# Patient Record
Sex: Female | Born: 1945 | Race: White | Hispanic: No | Smoking: Former smoker
Health system: Southern US, Community
[De-identification: ages and names within clinical notes are randomized; demographics above are authoritative.]

## PROBLEM LIST (undated history)

## (undated) DIAGNOSIS — I219 Acute myocardial infarction, unspecified: Secondary | ICD-10-CM

## (undated) DIAGNOSIS — I639 Cerebral infarction, unspecified: Secondary | ICD-10-CM

## (undated) DIAGNOSIS — E119 Type 2 diabetes mellitus without complications: Secondary | ICD-10-CM

## (undated) DIAGNOSIS — I10 Essential (primary) hypertension: Secondary | ICD-10-CM

## (undated) DIAGNOSIS — I2510 Atherosclerotic heart disease of native coronary artery without angina pectoris: Secondary | ICD-10-CM

## (undated) HISTORY — DX: Cerebral infarction, unspecified: I63.9

## (undated) HISTORY — PX: APPENDECTOMY: SHX54

## (undated) HISTORY — DX: Acute myocardial infarction, unspecified: I21.9

## (undated) HISTORY — PX: CARDIAC CATHETERIZATION: SHX172

## (undated) HISTORY — PX: ABDOMINAL HYSTERECTOMY: SHX81

---

## 1998-12-28 ENCOUNTER — Other Ambulatory Visit: Admission: RE | Admit: 1998-12-28 | Discharge: 1998-12-28 | Payer: Self-pay | Admitting: Family Medicine

## 2002-06-01 ENCOUNTER — Inpatient Hospital Stay (HOSPITAL_COMMUNITY): Admission: AD | Admit: 2002-06-01 | Discharge: 2002-06-05 | Payer: Self-pay | Admitting: Cardiology

## 2004-10-04 ENCOUNTER — Ambulatory Visit: Payer: Self-pay | Admitting: Family Medicine

## 2004-10-18 ENCOUNTER — Ambulatory Visit: Payer: Self-pay | Admitting: Family Medicine

## 2005-02-02 ENCOUNTER — Ambulatory Visit: Payer: Self-pay | Admitting: Family Medicine

## 2005-05-08 ENCOUNTER — Ambulatory Visit: Payer: Self-pay | Admitting: Family Medicine

## 2005-06-08 ENCOUNTER — Ambulatory Visit: Payer: Self-pay | Admitting: Family Medicine

## 2005-10-02 ENCOUNTER — Ambulatory Visit: Payer: Self-pay | Admitting: Family Medicine

## 2017-02-12 DIAGNOSIS — N390 Urinary tract infection, site not specified: Secondary | ICD-10-CM

## 2017-02-12 DIAGNOSIS — N179 Acute kidney failure, unspecified: Secondary | ICD-10-CM

## 2017-02-13 DIAGNOSIS — N179 Acute kidney failure, unspecified: Secondary | ICD-10-CM | POA: Diagnosis not present

## 2017-02-13 DIAGNOSIS — N390 Urinary tract infection, site not specified: Secondary | ICD-10-CM | POA: Diagnosis not present

## 2017-02-14 DIAGNOSIS — F419 Anxiety disorder, unspecified: Secondary | ICD-10-CM | POA: Diagnosis not present

## 2017-02-14 DIAGNOSIS — N200 Calculus of kidney: Secondary | ICD-10-CM | POA: Diagnosis not present

## 2017-02-14 DIAGNOSIS — N390 Urinary tract infection, site not specified: Secondary | ICD-10-CM | POA: Diagnosis not present

## 2017-02-14 DIAGNOSIS — J9601 Acute respiratory failure with hypoxia: Secondary | ICD-10-CM | POA: Diagnosis not present

## 2017-02-14 DIAGNOSIS — A419 Sepsis, unspecified organism: Secondary | ICD-10-CM | POA: Diagnosis not present

## 2017-02-14 DIAGNOSIS — I10 Essential (primary) hypertension: Secondary | ICD-10-CM

## 2017-02-14 DIAGNOSIS — N23 Unspecified renal colic: Secondary | ICD-10-CM | POA: Diagnosis not present

## 2017-02-14 DIAGNOSIS — N179 Acute kidney failure, unspecified: Secondary | ICD-10-CM | POA: Diagnosis not present

## 2017-02-15 DIAGNOSIS — N179 Acute kidney failure, unspecified: Secondary | ICD-10-CM | POA: Diagnosis not present

## 2017-02-15 DIAGNOSIS — N200 Calculus of kidney: Secondary | ICD-10-CM | POA: Diagnosis not present

## 2017-02-15 DIAGNOSIS — A419 Sepsis, unspecified organism: Secondary | ICD-10-CM | POA: Diagnosis not present

## 2017-02-15 DIAGNOSIS — N390 Urinary tract infection, site not specified: Secondary | ICD-10-CM | POA: Diagnosis not present

## 2017-02-15 DIAGNOSIS — N23 Unspecified renal colic: Secondary | ICD-10-CM | POA: Diagnosis not present

## 2017-02-15 DIAGNOSIS — J9601 Acute respiratory failure with hypoxia: Secondary | ICD-10-CM | POA: Diagnosis not present

## 2017-02-15 DIAGNOSIS — I10 Essential (primary) hypertension: Secondary | ICD-10-CM | POA: Diagnosis not present

## 2017-02-15 DIAGNOSIS — F419 Anxiety disorder, unspecified: Secondary | ICD-10-CM | POA: Diagnosis not present

## 2017-02-16 DIAGNOSIS — A419 Sepsis, unspecified organism: Secondary | ICD-10-CM | POA: Diagnosis not present

## 2017-02-16 DIAGNOSIS — I10 Essential (primary) hypertension: Secondary | ICD-10-CM | POA: Diagnosis not present

## 2017-02-16 DIAGNOSIS — N179 Acute kidney failure, unspecified: Secondary | ICD-10-CM | POA: Diagnosis not present

## 2017-02-16 DIAGNOSIS — N390 Urinary tract infection, site not specified: Secondary | ICD-10-CM | POA: Diagnosis not present

## 2017-02-16 DIAGNOSIS — N23 Unspecified renal colic: Secondary | ICD-10-CM | POA: Diagnosis not present

## 2017-02-16 DIAGNOSIS — N200 Calculus of kidney: Secondary | ICD-10-CM | POA: Diagnosis not present

## 2017-02-16 DIAGNOSIS — F419 Anxiety disorder, unspecified: Secondary | ICD-10-CM | POA: Diagnosis not present

## 2017-02-16 DIAGNOSIS — J9601 Acute respiratory failure with hypoxia: Secondary | ICD-10-CM | POA: Diagnosis not present

## 2017-02-17 DIAGNOSIS — A419 Sepsis, unspecified organism: Secondary | ICD-10-CM | POA: Diagnosis not present

## 2017-02-17 DIAGNOSIS — E1169 Type 2 diabetes mellitus with other specified complication: Secondary | ICD-10-CM | POA: Diagnosis not present

## 2017-02-17 DIAGNOSIS — N23 Unspecified renal colic: Secondary | ICD-10-CM | POA: Diagnosis not present

## 2017-02-17 DIAGNOSIS — I10 Essential (primary) hypertension: Secondary | ICD-10-CM | POA: Diagnosis not present

## 2017-02-17 DIAGNOSIS — J9601 Acute respiratory failure with hypoxia: Secondary | ICD-10-CM | POA: Diagnosis not present

## 2017-02-17 DIAGNOSIS — N179 Acute kidney failure, unspecified: Secondary | ICD-10-CM | POA: Diagnosis not present

## 2017-02-17 DIAGNOSIS — N200 Calculus of kidney: Secondary | ICD-10-CM | POA: Diagnosis not present

## 2017-02-17 DIAGNOSIS — F419 Anxiety disorder, unspecified: Secondary | ICD-10-CM | POA: Diagnosis not present

## 2017-02-17 DIAGNOSIS — N390 Urinary tract infection, site not specified: Secondary | ICD-10-CM | POA: Diagnosis not present

## 2017-02-19 DIAGNOSIS — F419 Anxiety disorder, unspecified: Secondary | ICD-10-CM | POA: Diagnosis not present

## 2017-02-19 DIAGNOSIS — I10 Essential (primary) hypertension: Secondary | ICD-10-CM | POA: Diagnosis not present

## 2017-02-19 DIAGNOSIS — E1169 Type 2 diabetes mellitus with other specified complication: Secondary | ICD-10-CM | POA: Diagnosis not present

## 2017-02-19 DIAGNOSIS — N390 Urinary tract infection, site not specified: Secondary | ICD-10-CM | POA: Diagnosis not present

## 2017-02-19 DIAGNOSIS — E160 Drug-induced hypoglycemia without coma: Secondary | ICD-10-CM | POA: Diagnosis not present

## 2017-02-20 DIAGNOSIS — I10 Essential (primary) hypertension: Secondary | ICD-10-CM | POA: Diagnosis not present

## 2017-02-20 DIAGNOSIS — N390 Urinary tract infection, site not specified: Secondary | ICD-10-CM | POA: Diagnosis not present

## 2017-02-20 DIAGNOSIS — E1169 Type 2 diabetes mellitus with other specified complication: Secondary | ICD-10-CM | POA: Diagnosis not present

## 2017-02-20 DIAGNOSIS — F419 Anxiety disorder, unspecified: Secondary | ICD-10-CM | POA: Diagnosis not present

## 2017-02-20 DIAGNOSIS — E160 Drug-induced hypoglycemia without coma: Secondary | ICD-10-CM | POA: Diagnosis not present

## 2017-02-21 DIAGNOSIS — N200 Calculus of kidney: Secondary | ICD-10-CM | POA: Diagnosis not present

## 2017-02-21 DIAGNOSIS — E160 Drug-induced hypoglycemia without coma: Secondary | ICD-10-CM | POA: Diagnosis not present

## 2017-02-21 DIAGNOSIS — F419 Anxiety disorder, unspecified: Secondary | ICD-10-CM | POA: Diagnosis not present

## 2017-02-21 DIAGNOSIS — E1169 Type 2 diabetes mellitus with other specified complication: Secondary | ICD-10-CM | POA: Diagnosis not present

## 2017-02-21 DIAGNOSIS — I10 Essential (primary) hypertension: Secondary | ICD-10-CM | POA: Diagnosis not present

## 2017-02-21 DIAGNOSIS — N390 Urinary tract infection, site not specified: Secondary | ICD-10-CM | POA: Diagnosis not present

## 2017-02-22 DIAGNOSIS — E160 Drug-induced hypoglycemia without coma: Secondary | ICD-10-CM | POA: Diagnosis not present

## 2017-02-22 DIAGNOSIS — I10 Essential (primary) hypertension: Secondary | ICD-10-CM | POA: Diagnosis not present

## 2017-02-22 DIAGNOSIS — E1169 Type 2 diabetes mellitus with other specified complication: Secondary | ICD-10-CM | POA: Diagnosis not present

## 2017-02-22 DIAGNOSIS — F419 Anxiety disorder, unspecified: Secondary | ICD-10-CM | POA: Diagnosis not present

## 2017-02-22 DIAGNOSIS — N200 Calculus of kidney: Secondary | ICD-10-CM | POA: Diagnosis not present

## 2017-02-22 DIAGNOSIS — N390 Urinary tract infection, site not specified: Secondary | ICD-10-CM | POA: Diagnosis not present

## 2017-02-23 DIAGNOSIS — F419 Anxiety disorder, unspecified: Secondary | ICD-10-CM | POA: Diagnosis not present

## 2017-02-23 DIAGNOSIS — N200 Calculus of kidney: Secondary | ICD-10-CM | POA: Diagnosis not present

## 2017-02-23 DIAGNOSIS — I10 Essential (primary) hypertension: Secondary | ICD-10-CM | POA: Diagnosis not present

## 2017-02-23 DIAGNOSIS — E1169 Type 2 diabetes mellitus with other specified complication: Secondary | ICD-10-CM | POA: Diagnosis not present

## 2017-02-23 DIAGNOSIS — E160 Drug-induced hypoglycemia without coma: Secondary | ICD-10-CM | POA: Diagnosis not present

## 2017-02-23 DIAGNOSIS — N390 Urinary tract infection, site not specified: Secondary | ICD-10-CM | POA: Diagnosis not present

## 2017-05-13 ENCOUNTER — Inpatient Hospital Stay (HOSPITAL_COMMUNITY)
Admission: AD | Admit: 2017-05-13 | Discharge: 2017-05-15 | DRG: 066 | Disposition: A | Discharge status: Home / Self Care | Payer: Medicare Other | Source: Other Acute Inpatient Hospital | Attending: Family Medicine | Admitting: Family Medicine

## 2017-05-13 ENCOUNTER — Encounter (HOSPITAL_COMMUNITY): Payer: Self-pay | Admitting: Internal Medicine

## 2017-05-13 DIAGNOSIS — E1151 Type 2 diabetes mellitus with diabetic peripheral angiopathy without gangrene: Secondary | ICD-10-CM | POA: Diagnosis present

## 2017-05-13 DIAGNOSIS — E669 Obesity, unspecified: Secondary | ICD-10-CM | POA: Diagnosis present

## 2017-05-13 DIAGNOSIS — Z90710 Acquired absence of both cervix and uterus: Secondary | ICD-10-CM | POA: Diagnosis not present

## 2017-05-13 DIAGNOSIS — G8321 Monoplegia of upper limb affecting right dominant side: Secondary | ICD-10-CM | POA: Diagnosis present

## 2017-05-13 DIAGNOSIS — G459 Transient cerebral ischemic attack, unspecified: Secondary | ICD-10-CM | POA: Diagnosis present

## 2017-05-13 DIAGNOSIS — E039 Hypothyroidism, unspecified: Secondary | ICD-10-CM | POA: Diagnosis present

## 2017-05-13 DIAGNOSIS — Z955 Presence of coronary angioplasty implant and graft: Secondary | ICD-10-CM

## 2017-05-13 DIAGNOSIS — Z6835 Body mass index (BMI) 35.0-35.9, adult: Secondary | ICD-10-CM

## 2017-05-13 DIAGNOSIS — Z87891 Personal history of nicotine dependence: Secondary | ICD-10-CM

## 2017-05-13 DIAGNOSIS — M720 Palmar fascial fibromatosis [Dupuytren]: Secondary | ICD-10-CM | POA: Diagnosis present

## 2017-05-13 DIAGNOSIS — E876 Hypokalemia: Secondary | ICD-10-CM | POA: Diagnosis present

## 2017-05-13 DIAGNOSIS — R29701 NIHSS score 1: Secondary | ICD-10-CM | POA: Diagnosis present

## 2017-05-13 DIAGNOSIS — E1159 Type 2 diabetes mellitus with other circulatory complications: Secondary | ICD-10-CM | POA: Diagnosis present

## 2017-05-13 DIAGNOSIS — I2510 Atherosclerotic heart disease of native coronary artery without angina pectoris: Secondary | ICD-10-CM

## 2017-05-13 DIAGNOSIS — I10 Essential (primary) hypertension: Secondary | ICD-10-CM | POA: Diagnosis present

## 2017-05-13 DIAGNOSIS — I639 Cerebral infarction, unspecified: Principal | ICD-10-CM | POA: Diagnosis present

## 2017-05-13 DIAGNOSIS — E785 Hyperlipidemia, unspecified: Secondary | ICD-10-CM | POA: Diagnosis present

## 2017-05-13 DIAGNOSIS — G458 Other transient cerebral ischemic attacks and related syndromes: Secondary | ICD-10-CM | POA: Diagnosis not present

## 2017-05-13 HISTORY — DX: Essential (primary) hypertension: I10

## 2017-05-13 HISTORY — DX: Type 2 diabetes mellitus without complications: E11.9

## 2017-05-13 HISTORY — DX: Atherosclerotic heart disease of native coronary artery without angina pectoris: I25.10

## 2017-05-13 MED ORDER — STROKE: EARLY STAGES OF RECOVERY BOOK
Freq: Once | Status: AC
  Administered 2017-05-14
  Filled 2017-05-13: qty 1

## 2017-05-13 MED ORDER — LEVOTHYROXINE SODIUM 100 MCG IV SOLR
50.0000 ug | Freq: Every day | INTRAVENOUS | Status: DC
  Administered 2017-05-14 – 2017-05-15 (×2): 50 ug via INTRAVENOUS
  Filled 2017-05-13 (×2): qty 5

## 2017-05-13 MED ORDER — ACETAMINOPHEN 160 MG/5ML PO SOLN: 650.0000 mg | ORAL | Status: DC | PRN

## 2017-05-13 MED ORDER — INSULIN DETEMIR 100 UNIT/ML ~~LOC~~ SOLN
20.0000 [IU] | Freq: Two times a day (BID) | SUBCUTANEOUS | Status: DC
  Administered 2017-05-14 – 2017-05-15 (×3): 20 [IU] via SUBCUTANEOUS
  Filled 2017-05-13 (×4): qty 0.2

## 2017-05-13 MED ORDER — ASPIRIN 325 MG PO TABS
325.0000 mg | ORAL_TABLET | Freq: Every day | ORAL | Status: DC
  Administered 2017-05-14 – 2017-05-15 (×2): 325 mg via ORAL
  Filled 2017-05-13 (×2): qty 1

## 2017-05-13 MED ORDER — ACETAMINOPHEN 650 MG RE SUPP: 650.0000 mg | RECTAL | Status: DC | PRN

## 2017-05-13 MED ORDER — INSULIN ASPART 100 UNIT/ML ~~LOC~~ SOLN
0.0000 [IU] | Freq: Three times a day (TID) | SUBCUTANEOUS | Status: DC
  Administered 2017-05-14: 2 [IU] via SUBCUTANEOUS
  Administered 2017-05-14: 1 [IU] via SUBCUTANEOUS
  Administered 2017-05-14: 2 [IU] via SUBCUTANEOUS
  Administered 2017-05-15: 3 [IU] via SUBCUTANEOUS

## 2017-05-13 MED ORDER — ENOXAPARIN SODIUM 40 MG/0.4ML ~~LOC~~ SOLN
40.0000 mg | Freq: Every day | SUBCUTANEOUS | Status: DC
  Administered 2017-05-14 – 2017-05-15 (×2): 40 mg via SUBCUTANEOUS
  Filled 2017-05-13 (×2): qty 0.4

## 2017-05-13 MED ORDER — IRBESARTAN 300 MG PO TABS
300.0000 mg | ORAL_TABLET | Freq: Every day | ORAL | Status: DC
  Administered 2017-05-14 – 2017-05-15 (×2): 300 mg via ORAL
  Filled 2017-05-13 (×2): qty 1

## 2017-05-13 MED ORDER — ACETAMINOPHEN 325 MG PO TABS: 650.0000 mg | ORAL_TABLET | ORAL | Status: DC | PRN

## 2017-05-13 MED ORDER — ASPIRIN 300 MG RE SUPP: 300.0000 mg | Freq: Every day | RECTAL | Status: DC
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
## 2017-05-14 ENCOUNTER — Inpatient Hospital Stay (HOSPITAL_COMMUNITY): Payer: Medicare Other

## 2017-05-14 DIAGNOSIS — G459 Transient cerebral ischemic attack, unspecified: Secondary | ICD-10-CM

## 2017-05-14 DIAGNOSIS — G458 Other transient cerebral ischemic attacks and related syndromes: Secondary | ICD-10-CM

## 2017-05-14 LAB — COMPREHENSIVE METABOLIC PANEL
ALBUMIN: 3.1 g/dL — AB (ref 3.5–5.0)
ALT: 16 U/L (ref 14–54)
ANION GAP: 8 (ref 5–15)
AST: 25 U/L (ref 15–41)
Alkaline Phosphatase: 49 U/L (ref 38–126)
CHLORIDE: 105 mmol/L (ref 101–111)
CO2: 26 mmol/L (ref 22–32)
Calcium: 8.9 mg/dL (ref 8.9–10.3)
Creatinine, Ser: 1.04 mg/dL — ABNORMAL HIGH (ref 0.44–1.00)
GFR calc Af Amer: >60 mL/min (ref >60)
GFR calc non Af Amer: 53 mL/min — ABNORMAL LOW (ref >60)
GLUCOSE: 172 mg/dL — AB (ref 65–99)
POTASSIUM: 2.5 mmol/L — AB (ref 3.5–5.1)
SODIUM: 139 mmol/L (ref 135–145)
Total Bilirubin: 0.5 mg/dL (ref 0.3–1.2)
Total Protein: 5.7 g/dL — ABNORMAL LOW (ref 6.5–8.1)

## 2017-05-14 LAB — CBC
HEMATOCRIT: 39.1 % (ref 36.0–46.0)
HEMOGLOBIN: 13 g/dL (ref 12.0–15.0)
MCH: 28.9 pg (ref 26.0–34.0)
MCHC: 33.2 g/dL (ref 30.0–36.0)
MCV: 86.9 fL (ref 78.0–100.0)
Platelets: 293 10*3/uL (ref 150–400)
RBC: 4.5 MIL/uL (ref 3.87–5.11)
RDW: 14.1 % (ref 11.5–15.5)
WBC: 7.4 10*3/uL (ref 4.0–10.5)

## 2017-05-14 LAB — VAS US CAROTID
LCCAPDIAS: 7 cm/s
LCCAPSYS: 67 cm/s
LEFT ECA DIAS: -12 cm/s
LEFT VERTEBRAL DIAS: 11 cm/s
LICADDIAS: -22 cm/s
LICAPDIAS: -13 cm/s
LICAPSYS: -73 cm/s
Left CCA dist dias: -11 cm/s
Left CCA dist sys: -62 cm/s
Left ICA dist sys: -93 cm/s
RCCAPDIAS: 22 cm/s
RIGHT ECA DIAS: -3 cm/s
RIGHT VERTEBRAL DIAS: 6 cm/s
Right CCA prox sys: 71 cm/s
Right cca dist sys: -71 cm/s

## 2017-05-14 LAB — LIPID PANEL
CHOLESTEROL: 257 mg/dL — AB (ref 0–200)
HDL: 36 mg/dL — ABNORMAL LOW (ref >40)
LDL CALC: 187 mg/dL — AB (ref 0–99)
TRIGLYCERIDES: 168 mg/dL — AB (ref <150)
Total CHOL/HDL Ratio: 7.1 RATIO
VLDL: 34 mg/dL (ref 0–40)

## 2017-05-14 LAB — GLUCOSE, CAPILLARY
GLUCOSE-CAPILLARY: 167 mg/dL — AB (ref 65–99)
GLUCOSE-CAPILLARY: 144 mg/dL — AB (ref 65–99)
GLUCOSE-CAPILLARY: 201 mg/dL — AB (ref 65–99)
Glucose-Capillary: 172 mg/dL — ABNORMAL HIGH (ref 65–99)

## 2017-05-14 LAB — ECHOCARDIOGRAM COMPLETE
HEIGHTINCHES: 60 in
Weight: 2926.4 oz

## 2017-05-14 LAB — MAGNESIUM: Magnesium: 1.7 mg/dL (ref 1.7–2.4)

## 2017-05-14 LAB — HEMOGLOBIN A1C
Hgb A1c MFr Bld: 8.2 % — ABNORMAL HIGH (ref 4.8–5.6)
Mean Plasma Glucose: 188.64 mg/dL

## 2017-05-14 MED ORDER — ATORVASTATIN CALCIUM 80 MG PO TABS
80.0000 mg | ORAL_TABLET | Freq: Every day | ORAL | Status: DC
  Administered 2017-05-14: 80 mg via ORAL
  Filled 2017-05-14: qty 1

## 2017-05-14 MED ORDER — POTASSIUM CHLORIDE CRYS ER 20 MEQ PO TBCR
40.0000 meq | EXTENDED_RELEASE_TABLET | Freq: Once | ORAL | Status: AC
  Administered 2017-05-14: 40 meq via ORAL
  Filled 2017-05-14: qty 2

## 2017-05-14 MED ORDER — POTASSIUM CHLORIDE 10 MEQ/100ML IV SOLN
10.0000 meq | INTRAVENOUS | Status: AC
  Administered 2017-05-14 (×4): 10 meq via INTRAVENOUS
  Filled 2017-05-14 (×4): qty 100

## 2017-05-14 MED ORDER — POTASSIUM CHLORIDE 10 MEQ/100ML IV SOLN: 10.0000 meq | INTRAVENOUS | Status: DC

## 2017-05-14 NOTE — Evaluation (Signed)
Physical Therapy Evaluation Patient Details Name: Maria Thomas MRN: [id number] DOB: 1946-02-05 Today's Date: 05/14/2017   History of Present Illness  Maria Thomas is a 71 y.o. female with history of diabetes mellitus, CAD status post stenting, hypertension, hypothyroidism had presented to the ER at [facility] with complaints of right upper extremity numbness and numbness in her tongue. Patient is a poor historian and history changes frequently.  Clinical Impression  Pt admitted with above. Unsure of pt's baseline cognition. Pt reports she is sedentary at baseline and her and her son only eat fast food. She cares for her self and her son does the driving and household chores. Pt became very SOB, SPO2 >92% on RA during amb and stair negotiation requiring to sit. This could be due to patients lifestyle as she doesn't amb more than household distances. Pt did not report or demo any R or L sided numbness or weakness. Pt mildly unsteady on feet however suspect she is functioning near her baseline. Acute PT to con't to follow to progress activity tolerance and improve stability.    Follow Up Recommendations No PT follow up;Supervision/Assistance - 24 hour    Equipment Recommendations  None recommended by PT    Recommendations for Other Services       Precautions / Restrictions Precautions Precautions: Fall Precaution Comments: SOB with activity Restrictions Weight Bearing Restrictions: No      Mobility  Bed Mobility Overal bed mobility: Modified Independent             General bed mobility comments: pt able to complete without physical help but was labored effort  Transfers Overall transfer level: Needs assistance Equipment used: None Transfers: Sit to/from Stand Sit to Stand: Min guard            Ambulation/Gait Ambulation/Gait assistance: Min guard Ambulation Distance (Feet): 120 Feet Assistive device: None Gait Pattern/deviations: Step-through pattern Gait  velocity: initially fast but then slowed down due to SOB Gait velocity interpretation: at or above normal speed for age/gender (then decreased) General Gait Details: pt started out at quick pace but then became SOB and could not walk any further requiring a chair to sit  Stairs Stairs: Yes Stairs assistance: Min guard Stair Management: One rail Right;Alternating pattern Number of Stairs: 5 General stair comments: + SOB but did not need physical assist  Wheelchair Mobility    Modified Rankin (Stroke Patients Only) Modified Rankin (Stroke Patients Only) Pre-Morbid Rankin Score: No significant disability Modified Rankin: Slight disability     Balance Overall balance assessment: No apparent balance deficits (not formally assessed)                                           Pertinent Vitals/Pain Pain Assessment: No/denies pain    Home Living Family/patient expects to be discharged to:: Private residence Living Arrangements: Children (adult son) Available Help at Discharge: Family;Available 24 hours/day Type of Home: House Home Access: Stairs to enter Entrance Stairs-Rails: Right Entrance Stairs-Number of Steps: 5 Home Layout: One level Home Equipment: None      Prior Function Level of Independence: Independent         Comments: pt reports she never leaves the house because she doesn't have reason too. Pt reports they don't cook and eats fast food for all meals. Son goes to get it. Pt is able to do personal hygiene but reports wearing  the same clothes at home.      Hand Dominance        Extremity/Trunk Assessment   Upper Extremity Assessment Upper Extremity Assessment: RUE deficits/detail RUE Deficits / Details: 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, tricep 2+/5    Lower Extremity Assessment Lower Extremity Assessment: Overall WFL for tasks assessed    Cervical / Trunk Assessment Cervical / Trunk Assessment: Normal  Communication   Communication:  No difficulties  Cognition Arousal/Alertness: Awake/alert Behavior During Therapy: WFL for tasks assessed/performed Overall Cognitive Status: No family/caregiver present to determine baseline cognitive functioning                                 General Comments: pt not oriented to date, year only. Pt poor historian and changes home set up and history.      General Comments      Exercises     Assessment/Plan    PT Assessment Patient needs continued PT services  PT Problem List Decreased strength;Decreased activity tolerance;Decreased balance;Decreased mobility;Cardiopulmonary status limiting activity;Decreased cognition;Decreased safety awareness;Decreased knowledge of precautions       PT Treatment Interventions DME instruction;Gait training;Stair training;Functional mobility training;Therapeutic activities;Therapeutic exercise    PT Goals (Current goals can be found in the Care Plan section)  Acute Rehab PT Goals Patient Stated Goal: return home PT Goal Formulation: With patient Time For Goal Achievement: 05/21/17 Potential to Achieve Goals: Good Additional Goals Additional Goal #1: Pt to score >19 on DGI to indicate minimal falls risk.    Frequency Min 3X/week   Barriers to discharge        Co-evaluation               AM-PAC PT "6 Clicks" Daily Activity  Outcome Measure Difficulty turning over in bed (including adjusting bedclothes, sheets and blankets)?: None Difficulty moving from lying on back to sitting on the side of the bed? : None Difficulty sitting down on and standing up from a chair with arms (e.g., wheelchair, bedside commode, etc,.)?: None Help needed moving to and from a bed to chair (including a wheelchair)?: A Little Help needed walking in hospital room?: A Little Help needed climbing 3-5 steps with a railing? : A Little 6 Click Score: 21    End of Session Equipment Utilized During Treatment: Gait belt Activity Tolerance:   (limited by SOB) Patient left: in chair;with call bell/phone within reach;with chair alarm set;with nursing/sitter in room Nurse Communication: Mobility status PT Visit Diagnosis: Unsteadiness on feet (R26.81)    Time: 2956-2130 PT Time Calculation (min) (ACUTE ONLY): 23 min   Charges:   PT Evaluation $PT Eval Moderate Complexity: 1 Mod PT Treatments $Gait Training: 8-22 mins   PT G Codes:        Lewis Shock, PT, DPT Pager #: [phone] Office #: [phone]   Jakyron Fabro M Menucha Dicesare 05/14/2017, 9:50 AM

## 2017-05-14 NOTE — Progress Notes (Signed)
STROKE TEAM PROGRESS NOTE   HISTORY OF PRESENT ILLNESS (per record) Maria Thomas is an 71 y.o. female CAD, DM and HTN with R arm Dupuytren's contracture who presented to [facility] for urinary symptoms and lower abdominal pain.  She stated that on 9/1 she felt weak in her right arm for most of the day- she thought it was because she used it more than normal for her hobby of completing jigsaw puzzles. Her symptoms resolved and she denies any sudden onset weakness in any one extremity, denies unilateral numbness, loss of vision, slurring of speech, facial droop or gait imbalance.   She was transferred to [facility] for TIA workup.    Patient was not administered IV t-PA secondary to resolution of symptoms . She was admitted to General Neurology for further evaluation and treatment.   SUBJECTIVE (INTERVAL HISTORY) Her son is at the bedside.  The patient is awake, alert, and follows all commands appropriately.  LDL 187.  HgbA1C 8.2   OBJECTIVE Temp:  [97.8 F (36.6 C)-98.5 F (36.9 C)] 98.2 F (36.8 C) (09/03 1500) Pulse Rate:  [77-89] 79 (09/03 1500) Cardiac Rhythm: Normal sinus rhythm (09/03 0700) Resp:  [17-18] 18 (09/03 1500) BP: (134-168)/(50-122) 159/84 (09/03 1500) SpO2:  [96 %-100 %] 100 % (09/03 1500) Weight:  [83 kg (182 lb 14.4 oz)] 83 kg (182 lb 14.4 oz) (09/02 2218)  CBC:   Recent Labs Lab 05/14/17 0112  WBC 7.4  HGB 13.0  HCT 39.1  MCV 86.9  PLT 293    Basic Metabolic Panel:   Recent Labs Lab 05/14/17 0112  NA 139  K 2.5*  CL 105  CO2 26  GLUCOSE 172*  BUN <5*  CREATININE 1.04*  CALCIUM 8.9  MG 1.7    Lipid Panel:     Component Value Date/Time   CHOL 257 (H) 05/14/2017 0112   TRIG 168 (H) 05/14/2017 0112   HDL 36 (L) 05/14/2017 0112   CHOLHDL 7.1 05/14/2017 0112   VLDL 34 05/14/2017 0112   LDLCALC 187 (H) 05/14/2017 0112   HgbA1c:  Lab Results  Component Value Date   HGBA1C 8.2 (H) 05/14/2017   Urine Drug Screen: No results  found for: LABOPIA, COCAINSCRNUR, LABBENZ, AMPHETMU, THCU, LABBARB  Alcohol Level No results found for: [facility]  IMAGING  Dg Chest 2 View 05/14/2017 IMPRESSION: No active cardiopulmonary disease.   Mr Brain 29Wo Contrast Mr Maxine GlennMra Head Wo Contrast 05/14/2017 IMPRESSION: 1. Small acute lacunar infarct in the left thalamus with no associated hemorrhage or mass effect. 2. 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 No other acute intracranial abnormality. 3. Intracranial atherosclerosis with mild to moderate right ICA siphon stenosis, but otherwise negative for age intracranial MRA.  Carotid US 05/14/2017 Summary: Right: intimal wall thickening CCA. Mild to moderate calcific plaque origin and proximal ICA with acoustic shadowing. Tortuous distal ICA. Left: intimal wall thickening CCA. Mild mixed plaque origin and proximal ICA. Bilateral: 1-39% ICA stenosis. Vertebral artery flow is antegrade.  TTE 05/14/2017 Study Conclusions - Left ventricle: The cavity size was normal. Wall thickness was normal. Systolic function was normal. The estimated ejection fraction was in the range of 55% to 60%. Wall motion was normal; there were no regional wall motion abnormalities. - Mitral valve: Calcified annulus. - Left atrium: The atrium was mildly dilated.  PHYSICAL EXAM : Pleasant elderly Caucasian lady currently not in distress   . Afebrile. Head is nontraumatic. Neck is supple without bruit.  Cardiac exam no murmur or gallop. Lungs are clear to auscultation. Distal pulses are well felt.  Neurological Exam ;  Awake  Alert oriented x 3. Normal speech and language.eye movements full without nystagmus.fundi were not visualized. Vision acuity and fields appear normal. Hearing is normal. Palatal movements are normal. Face symmetric. Tongue midline. Normal strength, tone, reflexes and coordination except diminished fine finger movements on the right. Orbits left over right upper  extremity. Mild weakness of right grip.. Normal sensation. Gait deferred.  ASSESSMENT/PLAN Maria Thomas is a 71 y.o. female with history of CAD, DM and HTN with R arm Dupuytren's contracture who presented to [facility] for urinary symptoms and lower abdominal pain.  She stated that on 9/1 she felt weak in her right arm for most of the day- she thought it was because she used it more than normal for her hobby of completing jigsaw puzzles. She did not receive IV t-PA due to rapid resolution of symptoms.   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  Resultant  mild weakness in the right hand  CT head: no acute stroke.  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  MRI head: Small acute lacunar infarct in the left thalamus with no associated hemorrhage or mass effect.   MRA head: No LVO or high-grade stenosis.  Moderate R ICA stenosis.  Atherosclerosis.  Carotid Doppler: B ICA 1-39% stenosis, VAs antegrade  2D Echo: EF 55-60%. No source of embolus   LDL 187  HgbA1c 8.2  for VTE prophylaxis Diet heart healthy/carb modified Room service appropriate? Yes; Fluid consistency: Thin  No antithrombotic prior to admission, now on aspirin 325 mg daily  Patient counseled to be compliant with her antithrombotic medications  Ongoing aggressive stroke risk factor management  Therapy recommendations: None  Disposition: None  Hypertension  Stable  Permissive hypertension (OK if < 220/120) but gradually normalize in 5-7 days  Long-term BP goal normotensive  Hyperlipidemia  Home meds:  none  LDL 187, goal < 70  Add atorvastatin 80 mg PO daily  Continue statin at discharge  Diabetes  HgbA1c 8.2, goal < 7.0  Uncontrolled  Diabetes Coordinator consulted  Other Stroke Risk Factors  Advanced  age  Obesity, Body mass index is 35.72 kg/m., recommend weight loss, diet and exercise as appropriate   Coronary artery disease  Other Active Problems  None  Hospital day # 1 I have personally examined this patient, reviewed notes, independently viewed imaging studies, participated in medical decision making and plan of care.ROS completed by me personally and pertinent positives fully documented  I have made any additions or clarifications directly to the above note.  She presented with right arm weakness secondary to lacunar left thalamic infarct from small vessel disease. Start aspirin and Lipitor and aggressive risk factor modification and continue ongoing stroke evaluation. Greater than 50% time during this 35 minute visit was spent on counseling and coordination of care about lacunar infarct, stroke risk, evaluation, prevention and treatment discussion.  Delia Heady, MD Medical Director [facility] Pager: [phone] 05/14/2017 10:14 PM   To contact Stroke Continuity provider, please refer to [email or web address]. After hours, contact General Neurology

## 2017-05-14 NOTE — Evaluation (Signed)
Speech Language Pathology Evaluation Patient Details Name: Maria ComberLinda S Thomas MRN: [id number] DOB: [birth year] Today's Date: 05/14/2017 Time: [id number] SLP Time Calculation (min) (ACUTE ONLY): 26 min  Problem List:  Patient Active Problem List   Diagnosis Date Noted  . TIA (transient ischemic attack) 05/13/2017  . CAD (coronary artery disease) S/P STENT. 05/13/2017  . Type 2 diabetes mellitus with vascular disease (HCC) 05/13/2017  . Essential hypertension 05/13/2017   Past Medical History:  Past Medical History:  Diagnosis Date  . Coronary artery disease   . Diabetes mellitus without complication (HCC)   . Hypertension    Past Surgical History:  Past Surgical History:  Procedure Laterality Date  . ABDOMINAL HYSTERECTOMY    . APPENDECTOMY    . CARDIAC CATHETERIZATION     HPI:  71 y.o. female with history of diabetes mellitus, CAD status post stenting, hypertension, hypothyroidism had presented [facility] with complaints of right upper extremity numbness and numbness in her tongue. Patient is a poor historian and history changes frequently.  Dx TIA.   Assessment / Plan / Recommendation Clinical Impression  Pt presents with baseline deficits in short-term, verbal memory, with functional attention, normal language and speech.  Pt spends 5-6 hours/day working on puzzles and completing crosswords.  She has a sedentary lifestyle and limited responsibilities, as her son handles the housework/providing meals.  No SLP needs are identified - our services will sign off.     SLP Assessment  SLP Recommendation/Assessment: Patient does not need any further Speech Lanaguage Pathology Services    Follow Up Recommendations  None    Frequency and Duration           SLP Evaluation Cognition  Overall Cognitive Status: History of cognitive impairments - at baseline Arousal/Alertness: Awake/alert Orientation Level: Oriented to person;Oriented to place;Oriented to  time;Disoriented to situation Attention: Sustained Sustained Attention: Appears intact Memory: Impaired Memory Impairment: Retrieval deficit Safety/Judgment: Appears intact       Comprehension  Auditory Comprehension Overall Auditory Comprehension: Appears within functional limits for tasks assessed Visual Recognition/Discrimination Discrimination: Within Function Limits Reading Comprehension Reading Status: Within funtional limits    Expression Expression Primary Mode of Expression: Verbal Verbal Expression Overall Verbal Expression: Appears within functional limits for tasks assessed Written Expression Dominant Hand: Right Written Expression: Not tested   Oral / Motor  Oral Motor/Sensory Function Overall Oral Motor/Sensory Function: Within functional limits Motor Speech Overall Motor Speech: Appears within functional limits for tasks assessed   GO                    Blenda MountsCouture, Cindel Daugherty Laurice 05/14/2017, 4:02 PM

## 2017-05-14 NOTE — Progress Notes (Signed)
VASCULAR LAB PRELIMINARY  PRELIMINARY  PRELIMINARY  PRELIMINARY  Carotid duplex completed.    Preliminary report:  1-39% ICA stenosis.  Vertebral artery flow is antegrade.   Yomara Toothman, RVT 05/14/2017, 12:30 PM

## 2017-05-14 NOTE — Evaluation (Addendum)
Occupational Therapy Evaluation Patient Details Name: Maria Thomas Noh MRN: [id number] DOB: 1946-09-07 Today'Thomas Date: 05/14/2017    History of Present Illness Maria Thomas Levario is a 71 y.o. female with history of diabetes mellitus, CAD status post stenting, hypertension, hypothyroidism had presented to the ER at [facility] with complaints of right upper extremity numbness and numbness in her tongue. Patient is a poor historian and history changes frequently.   Clinical Impression   This 71 y/o F presents with the above. At baseline Pt is independent with ADLs and functional mobility. Pt lives with son who provides intermittent assistance at home and who assists with iADLs. Per Pt'Thomas son, Pt has baseline short term memory deficits. Pt completed room level functional mobility this session with MinGuard assist without AD, LB ADLs with MinA. Will continue to follow acutely to maximize Pt'Thomas overall safety and independence with ADLs and functional mobility prior to return home.     Follow Up Recommendations  No OT follow up;Supervision/Assistance - 24 hour    Equipment Recommendations  None recommended by OT           Precautions / Restrictions Precautions Precautions: Fall Precaution Comments: SOB with activity Restrictions Weight Bearing Restrictions: No      Mobility Bed Mobility Overal bed mobility: Modified Independent             General bed mobility comments: pt able to complete without physical help but was labored effort  Transfers Overall transfer level: Needs assistance Equipment used: None Transfers: Sit to/from Stand Sit to Stand: Min guard         General transfer comment: close guard for safety     Balance Overall balance assessment: No apparent balance deficits (not formally assessed)                                         ADL either performed or assessed with clinical judgement   ADL Overall ADL'Thomas : Needs  assistance/impaired Eating/Feeding: Set up;Sitting   Grooming: Oral care;Min guard;Standing   Upper Body Bathing: Min guard;Sitting   Lower Body Bathing: Min guard;Sit to/from stand   Upper Body Dressing : Minimal assistance;Sitting Upper Body Dressing Details (indicate cue type and reason): donning new gown  Lower Body Dressing: Minimal assistance;Sit to/from stand Lower Body Dressing Details (indicate cue type and reason): Pt adjusting socks sitting EOB without assist  Toilet Transfer: Min guard;Ambulation;Regular Social workerToilet   Toileting- Clothing Manipulation and Hygiene: Min guard;Sit to/from stand       Functional mobility during ADLs: Min guard       Vision Baseline Vision/History: Wears glasses Wears Glasses: Reading only Vision Assessment?: No apparent visual deficits                Pertinent Vitals/Pain Pain Assessment: No/denies pain     Hand Dominance Right   Extremity/Trunk Assessment Upper Extremity Assessment Upper Extremity Assessment: RUE deficits/detail RUE Deficits / Details: 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   Lower Extremity Assessment Lower Extremity Assessment: Defer to PT evaluation   Cervical / Trunk Assessment Cervical / Trunk Assessment: Normal   Communication Communication Communication: No difficulties   Cognition Arousal/Alertness: Awake/alert Behavior During Therapy: WFL for tasks assessed/performed Overall Cognitive Status: History of cognitive impairments - at baseline  General Comments: Pt poor historian, son present during session and reporting Pt has baseline STM deficits, son reports her current cognitive functioning is her baseline, reports he leaves Pt at home for a few hours intermittently with no safety concerns    General Comments                  Home Living Family/patient expects to be discharged to:: Private residence Living Arrangements: Children (adult son) Available  Help at Discharge: Family;Available PRN/intermittently Type of Home: House Home Access: Stairs to enter [employer] of Steps: 5 Entrance Stairs-Rails: Right Home Layout: One level     Bathroom Shower/Tub: Chief Strategy Officer: Standard Bathroom Accessibility: Yes   Home Equipment: Shower seat      Lives With: Son    Prior Functioning/Environment Level of Independence: Independent        Comments: Pt'Thomas son drives, reports she doesn't cook (eats fast food instead); Pt reports she is able to complete personal hygiene but doesn't do so daily         OT Problem List: Decreased activity tolerance;Decreased cognition      OT Treatment/Interventions: Self-care/ADL training;DME and/or AE instruction;Therapeutic activities;Balance training;Therapeutic exercise;Energy conservation;Patient/family education    OT Goals(Current goals can be found in the care plan section) Acute Rehab OT Goals Patient Stated Goal: return home OT Goal Formulation: With patient Time For Goal Achievement: 05/28/17 Potential to Achieve Goals: Good  OT Frequency: Min 2X/week                             AM-PAC PT "6 Clicks" Daily Activity     Outcome Measure Help from another person eating meals?: None Help from another person taking care of personal grooming?: A Little Help from another person toileting, which includes using toliet, bedpan, or urinal?: A Little Help from another person bathing (including washing, rinsing, drying)?: A Little Help from another person to put on and taking off regular upper body clothing?: A Little Help from another person to put on and taking off regular lower body clothing?: A Little 6 Click Score: 19   End of Session Equipment Utilized During Treatment: Gait belt Nurse Communication: Mobility status  Activity Tolerance: Patient tolerated treatment well Patient left: in chair;with call bell/phone within reach;with chair alarm  set;with family/visitor present  OT Visit Diagnosis: Muscle weakness (generalized) (M62.81)                Time: 1610-9604 OT Time Calculation (min): 23 min Charges:  OT General Charges $OT Visit: 1 Visit OT Evaluation $OT Eval Low Complexity: 1 Low G-Codes:     Marcy Siren, OT Pager [phone] 05/14/2017   Orlando Penner 05/14/2017, 3:27 PM

## 2017-05-14 NOTE — Consult Note (Addendum)
Requesting Physician: Dr. Toniann Fail    Chief Complaint:  R arm weakness on 05/12/2017.   History obtained from:  Patient    HPI:                                                                                                                                       Maria Thomas is an 71 y.o. female CAD, DM and HTN with R arm dupuytren's contracture who presented to [facility] for urinary symptoms and lower abdominal pain.  She stated that on 9/1 she felt weak in her right arm for most of the day- she thought it was because she used it more than normal for her hobby of completing jigsaw puzzles. Her symptoms resolved and she denies any sudden onset weakness in any one extremity, denies unilateral numbness, loss of vision, slurring of speech, facial droop or gait imbalance.   She was transferred to [facility] for TIA workup.     Past Medical History:  Diagnosis Date  . Coronary artery disease   . Diabetes mellitus without complication (HCC)   . Hypertension     Past Surgical History:  Procedure Laterality Date  . ABDOMINAL HYSTERECTOMY    . APPENDECTOMY    . CARDIAC CATHETERIZATION      Family History  Problem Relation Age of Onset  . Asthma Mother   . Brain cancer Father    Social History:  reports that she has quit smoking. She has never used smokeless tobacco. She reports that she does not drink alcohol or use drugs.  Allergies: Not on File  Medications:                                                                                                                           Reviewed home meds   ROS:  General ROS: negative for - chills, fatigue, fever, night sweats, weight gain or weight loss Psychological ROS: negative for - behavioral disorder, hallucinations, memory difficulties, mood swings or suicidal ideation Ophthalmic ROS:  negative for - blurry vision, double vision, eye pain or loss of vision ENT ROS: negative for - epistaxis, nasal discharge, oral lesions, sore throat, tinnitus or vertigo Allergy and Immunology ROS: negative for - hives or itchy/watery eyes Hematological and Lymphatic ROS: negative for - bleeding problems, bruising or swollen lymph nodes Endocrine ROS: negative for - galactorrhea, hair pattern changes, polydipsia/polyuria or temperature intolerance Respiratory ROS: negative for - cough, hemoptysis, shortness of breath or wheezing Cardiovascular ROS: negative for - chest pain, dyspnea on exertion, edema or irregular heartbeat Gastrointestinal ROS: negative for - abdominal pain, diarrhea, hematemesis, nausea/vomiting or stool incontinence Genito-Urinary ROS:  Dysuria + Musculoskeletal ROS: negative for - joint swelling or muscular weakness Neurological ROS: as noted in HPI Dermatological ROS: negative for rash and skin lesion changes   Examination:                                                                                                     General: Appears well-developed and well-nourished.  Psych: Affect appropriate to situation Eyes: No scleral injection HENT: No OP obstrucion Head: Normocephalic.  Cardiovascular: Normal rate and regular rhythm.  Respiratory: Effort normal and breath sounds normal to anterior ascultation GI: Soft.  No distension. There is no tenderness.  Skin: WDI   Neurological Examination Mental Status: Alert, oriented, thought content appropriate.  Speech fluent without evidence of aphasia.  Able to follow 3 step commands without difficulty. Cranial Nerves: II: Discs flat bilaterally; Visual fields grossly normal,  III,IV, VI: ptosis not present, extra-ocular motions intact bilaterally, pupils equal, round, reactive to light and accommodation V,VII: smile symmetric, facial light touch sensation normal bilaterally VIII: hearing normal bilaterally IX,X:  uvula rises symmetrically XI: bilateral shoulder shrug XII: midline tongue extension Motor: Right : Upper extremity   Contracted over left middle and index fingers    Left:     Upper extremity   5/5 Left :Lower extremity   5/5     Right : Lower extremity   5/5 Tone and bulk:normal tone throughout; no atrophy noted Sensory: Pinprick and light touch intact throughout, bilaterally Deep Tendon Reflexes: 2+ and symmetric throughout Plantars: Right: downgoing   Left: downgoing Cerebellar: normal finger-to-nose, normal rapid alternating movements and normal heel-to-shin test Gait: normal gait and station     Lab Results: Basic Metabolic Panel:  Recent Labs Lab 05/14/17 0112  NA 139  K 2.5*  CL 105  CO2 26  GLUCOSE 172*  BUN <5*  CREATININE 1.04*  CALCIUM 8.9    CBC:  Recent Labs Lab 05/14/17 0112  WBC 7.4  HGB 13.0  HCT 39.1  MCV 86.9  PLT 293    Coagulation Studies: No results for input(s): LABPROT, INR in the last 72 hours.  Imaging: Dg Chest 2 View  Result Date: 05/14/2017 CLINICAL DATA:  Transient ischemic attack EXAM: CHEST  2 VIEW COMPARISON:  02/14/2017 FINDINGS: The heart  size and mediastinal contours are within normal limits. Both lungs are clear. The visualized skeletal structures are unremarkable. IMPRESSION: No active cardiopulmonary disease. Electronically Signed   By: Tollie Eth M.D.   On: 05/14/2017 00:36     ASSESSMENT AND PLAN   Possible TIA    Recommend # MRI of the brain without contrast #MRA Head and neck  #Transthoracic Echo  # Start patient on ASA 325mg  #Start or continue Atorvastatin 80 mg/other high intensity statin # BP goal: permissive HTN upto 210 systolic, PRNs above 21 # HBAIC and Lipid profile # Telemetry monitoring # Frequent neuro checks # Bedside swallow screen   Sushanth Aroor MD [facility] [phone]  If 7pm to 7am, please call on call as listed on AMION.

## 2017-05-14 NOTE — Progress Notes (Signed)
05/14/2017 6:38 PM  I updated patient about results of MRI/MRA.  Will not discharge today.  Await for neuro to see tomorrow.  Maryln Manuel. Kenyah Luba MD

## 2017-05-14 NOTE — Progress Notes (Signed)
PROGRESS NOTE  Maria Thomas  UEA:[id number]  DOB: 05/08/1946  DOA: 05/13/2017 PCP: No primary care provider on file.  Brief Admission Hx: Maria Thomas is a 71 y.o. female with history of diabetes mellitus, CAD status post stenting, hypertension, hypothyroidism had presented to the ER at [facility] with complaints of right upper extremity numbness and numbness in her tongue. Patient is a poor historian and history changes frequently.  MDM/Assessment & Plan:   1. TIA - continue aspirin,  MRI/MRA brain 2-D echo carotid Doppler pending. 2. CAD status post stenting - denies any chest pain. Continue aspirin daily.  3. Hypertension on ARB. Allow for permissive hypertension. 4. Hypothyroidism on Synthroid.  DVT prophylaxis: Lovenox. Code Status: Full code.  Family Communication: Discussed with patient.  Disposition Plan: To be determined.  Consults called: Neurology and social worker.  Admission status: Inpatient.   Subjective: Pt says she feels better and her symptoms have resolved. She is sitting up in a chair.   Objective: Vitals:   05/14/17 0157 05/14/17 0415 05/14/17 0607 05/14/17 0608  BP: (!) 143/50 (!) 158/56 (!) 138/122 (!) 153/52  Pulse: 77 78 80 79  Resp: 17 17 17    Temp: 97.8 F (36.6 C) 98 F (36.7 C) 97.9 F (36.6 C)   TempSrc: Oral Oral Oral   SpO2: 99% 99% 96% 97%  Weight:      Height:        Intake/Output Summary (Last 24 hours) at 05/14/17 1144 Last data filed at 05/14/17 1100  Gross per 24 hour  Intake              240 ml  Output              150 ml  Net               90 ml   Filed Weights   05/13/17 2218  Weight: 83 kg (182 lb 14.4 oz)     REVIEW OF SYSTEMS  As per history otherwise all reviewed and reported negative  Exam:  General exam: awake, alert, NAD, cooperative.  Respiratory system: Clear. No increased work of breathing. Cardiovascular system: S1 & S2 heard, RRR. No JVD, murmurs, gallops, clicks or pedal  edema. Gastrointestinal system: Abdomen is nondistended, soft and nontender. Normal bowel sounds heard. Central nervous system: Alert and oriented. No focal neurological deficits. Extremities: no CCE.  Data Reviewed: Basic Metabolic Panel:  Recent Labs Lab 05/14/17 0112  NA 139  K 2.5*  CL 105  CO2 26  GLUCOSE 172*  BUN <5*  CREATININE 1.04*  CALCIUM 8.9  MG 1.7   Liver Function Tests:  Recent Labs Lab 05/14/17 0112  AST 25  ALT 16  ALKPHOS 49  BILITOT 0.5  PROT 5.7*  ALBUMIN 3.1*   No results for input(s): LIPASE, AMYLASE in the last 168 hours. No results for input(s): AMMONIA in the last 168 hours. CBC:  Recent Labs Lab 05/14/17 0112  WBC 7.4  HGB 13.0  HCT 39.1  MCV 86.9  PLT 293   Cardiac Enzymes: No results for input(s): CKTOTAL, CKMB, CKMBINDEX, TROPONINI in the last 168 hours. CBG (last 3)   Recent Labs  05/14/17 1008  GLUCAP 172*   No results found for this or any previous visit (from the past 240 hour(s)).   Studies: Dg Chest 2 View  Result Date: 05/14/2017 CLINICAL DATA:  Transient ischemic attack EXAM: CHEST  2 VIEW COMPARISON:  02/14/2017 FINDINGS: The heart size and mediastinal  contours are within normal limits. Both lungs are clear. The visualized skeletal structures are unremarkable. IMPRESSION: No active cardiopulmonary disease. Electronically Signed   By: Tollie Ethavid  Kwon M.D.   On: 05/14/2017 00:36     Scheduled Meds: . aspirin  300 mg Rectal Daily   Or  . aspirin  325 mg Oral Daily  . enoxaparin (LOVENOX) injection  40 mg Subcutaneous Daily  . insulin aspart  0-9 Units Subcutaneous TID WC  . insulin detemir  20 Units Subcutaneous BID  . irbesartan  300 mg Oral Daily  . levothyroxine  50 mcg Intravenous Daily   Continuous Infusions:  Principal Problem:   TIA (transient ischemic attack) Active Problems:   CAD (coronary artery disease) S/P STENT.   Type 2 diabetes mellitus with vascular disease (HCC)   Essential  hypertension  Time spent:   Standley Dakinslanford Johnson, MD, FAAFP [facility] Pager [phone]  If 7PM-7AM, please contact night-coverage [email or web address] Password [email or web address] 05/14/2017, 11:44 AM    LOS: 1 day

## 2017-05-14 NOTE — Progress Notes (Signed)
  Echocardiogram 2D Echocardiogram has been performed.  Maria Thomas 05/14/2017, 12:57 PM

## 2017-05-15 ENCOUNTER — Encounter (HOSPITAL_COMMUNITY): Payer: Self-pay | Admitting: Family Medicine

## 2017-05-15 DIAGNOSIS — I639 Cerebral infarction, unspecified: Principal | ICD-10-CM

## 2017-05-15 LAB — CBC
HCT: 39.3 % (ref 36.0–46.0)
HEMOGLOBIN: 12.7 g/dL (ref 12.0–15.0)
MCH: 28.3 pg (ref 26.0–34.0)
MCHC: 32.3 g/dL (ref 30.0–36.0)
MCV: 87.7 fL (ref 78.0–100.0)
PLATELETS: 287 10*3/uL (ref 150–400)
RBC: 4.48 MIL/uL (ref 3.87–5.11)
RDW: 14.4 % (ref 11.5–15.5)
WBC: 6.5 10*3/uL (ref 4.0–10.5)

## 2017-05-15 LAB — COMPREHENSIVE METABOLIC PANEL
ALT: 16 U/L (ref 14–54)
AST: 24 U/L (ref 15–41)
Albumin: 3.1 g/dL — ABNORMAL LOW (ref 3.5–5.0)
Alkaline Phosphatase: 46 U/L (ref 38–126)
Anion gap: 13 (ref 5–15)
BUN: 5 mg/dL — AB (ref 6–20)
CHLORIDE: 107 mmol/L (ref 101–111)
CO2: 21 mmol/L — ABNORMAL LOW (ref 22–32)
Calcium: 9.1 mg/dL (ref 8.9–10.3)
Creatinine, Ser: 0.96 mg/dL (ref 0.44–1.00)
GFR calc Af Amer: >60 mL/min (ref >60)
GFR, EST NON AFRICAN AMERICAN: 58 mL/min — AB (ref >60)
Glucose, Bld: 118 mg/dL — ABNORMAL HIGH (ref 65–99)
Potassium: 3 mmol/L — ABNORMAL LOW (ref 3.5–5.1)
Sodium: 141 mmol/L (ref 135–145)
Total Bilirubin: 0.6 mg/dL (ref 0.3–1.2)
Total Protein: 5.7 g/dL — ABNORMAL LOW (ref 6.5–8.1)

## 2017-05-15 LAB — GLUCOSE, CAPILLARY
GLUCOSE-CAPILLARY: 176 mg/dL — AB (ref 65–99)
GLUCOSE-CAPILLARY: 109 mg/dL — AB (ref 65–99)
Glucose-Capillary: 202 mg/dL — ABNORMAL HIGH (ref 65–99)

## 2017-05-15 LAB — MAGNESIUM: MAGNESIUM: 1.7 mg/dL (ref 1.7–2.4)

## 2017-05-15 MED ORDER — ATORVASTATIN CALCIUM 80 MG PO TABS: 80.0000 mg | ORAL_TABLET | Freq: Every day | ORAL | 0 refills | Status: AC

## 2017-05-15 MED ORDER — ASPIRIN 325 MG PO TABS: 325.0000 mg | ORAL_TABLET | Freq: Every day | ORAL | Status: AC

## 2017-05-15 MED ORDER — LIVING WELL WITH DIABETES BOOK
Freq: Once | Status: AC
  Administered 2017-05-15: 16:00:00
  Filled 2017-05-15: qty 1

## 2017-05-15 MED ORDER — INSULIN ASPART 100 UNIT/ML ~~LOC~~ SOLN: 5.0000 [IU] | Freq: Three times a day (TID) | SUBCUTANEOUS | Status: DC

## 2017-05-15 MED ORDER — POTASSIUM CHLORIDE CRYS ER 20 MEQ PO TBCR
40.0000 meq | EXTENDED_RELEASE_TABLET | Freq: Once | ORAL | Status: AC
  Administered 2017-05-15: 40 meq via ORAL
  Filled 2017-05-15: qty 2

## 2017-05-15 NOTE — Progress Notes (Signed)
Inpatient Diabetes Program Recommendations  AACE/ADA: New Consensus Statement on Inpatient Glycemic Control (2015)  Target Ranges:  Prepandial:   less than 140 mg/dL      Peak postprandial:   less than 180 mg/dL (1-2 hours)      Critically ill patients:  140 - 180 mg/dL   Lab Results  Component Value Date   GLUCAP 109 (H) 05/15/2017   HGBA1C 8.2 (H) 05/14/2017    Review of Glycemic Control  Results for Clois Maria Thomas, Chelcy S (MRN [id number]) as of 05/15/2017 09:06  Ref. Range 05/14/2017 10:08 05/14/2017 12:00 05/14/2017 18:18 05/14/2017 21:36 05/15/2017 08:00  Glucose-Capillary Latest Ref Range: 65 - 99 mg/dL 621172 (H) 308167 (H) 657144 (H) 201 (H) 109 (H)    Diabetes history: Type 2 Outpatient Diabetes medications: Levemir 30 units bid Current orders for Inpatient glycemic control: Levemir 20 units bid, Novolog 0-9 units tid  Inpatient Diabetes Program Recommendations: Agree with current medications for blood sugar management.   Susette RacerJulie Wilena Tyndall, RN, BA, MHA, CDE Diabetes Coordinator Inpatient Diabetes Program  [phone] (Team Pager) [phone] [facility] Office) 05/15/2017 9:11 AM

## 2017-05-15 NOTE — Discharge Summary (Signed)
Physician Discharge Summary  Maria ComberLinda S Maria Thomas Maria Thomas DOB: [birth year] DOA: 05/13/2017  PCP: [facility] Neurologist: Dr. Pearlean BrownieSethi  Admit date: 05/13/2017 Discharge date: 05/15/2017  Admitted From: Home  Disposition: Home   Recommendations for Outpatient Follow-up:  1. Follow up with PCP in 1 weeks 2. Follow up with neurologist in 6 weeks  3. Please obtain BMP/CBC in one week  Discharge Condition: STABLE   CODE STATUS: FULL    Brief Hospitalization Summary: Please see all hospital notes, images, labs for full details of the hospitalization.  HPI: Maria Maria Thomas is a 71 y.o. female with history of diabetes mellitus, CAD status post stenting, hypertension, hypothyroidism had presented to the ER at [facility] with complaints of right upper extremity numbness and numbness in her tongue. Patient is a poor historian and history changes frequently. As per the initial note patient's numbness started in the night previously and improved. On exam in the ER patient did not have any focal deficits and did have some Dupuytren's contracture in the right palm. Labs revealed hypokalemia. EKG was showing normal sinus rhythm. CT head is unremarkable and since patient will need MRI of the brain as per the tele neurologist patient [facility]. On exam patient appears nonfocal.   ED Course: Patient was admitted direct admit.  Brief Admission Hx: Maria Maria Thomas a 71 y.o.femalewith history of diabetes mellitus, CAD status post stenting, hypertension, hypothyroidism had [facility] with complaints of right upper extremity numbness and numbness in her tongue. Patient is a poor historian and history changes frequently.  MDM/Assessment & Plan:   1. Small acute lacunar infarct in the left thalamus with no associated hemorrhage or mass effect.- continue aspirin,  lipitor 80 mg, treat and control diabetes better, follow up with PCP in 1  week. No PT or OT follow up recommended.   2. MCAD status post stenting - denies any chest pain. Continue aspirin daily.  3. Hypertension on ARB. Allow for permissive hypertension for 5-7 days then lower to goal.   4. Hypothyroidism on Synthroid.  DVT prophylaxis:Lovenox. Code Status:Full code. Family Communication:Discussed with patient. Disposition Plan:To be determined. Consults called:Neurology and Child psychotherapistsocial worker. Admission status:Inpatient.   Discharge Diagnoses:  Principal Problem:   TIA (transient ischemic attack) Active Problems:   CAD (coronary artery disease) S/P STENT.   Type 2 diabetes mellitus with vascular disease (HCC)   Essential hypertension  Discharge Instructions: Discharge Instructions    Call MD for:  difficulty breathing, headache or visual disturbances    Complete by:  As directed    Call MD for:  extreme fatigue    Complete by:  As directed    Call MD for:  hives    Complete by:  As directed    Call MD for:  persistant dizziness or light-headedness    Complete by:  As directed    Call MD for:  persistant nausea and vomiting    Complete by:  As directed    Call MD for:  severe uncontrolled pain    Complete by:  As directed    Call MD for:  temperature >100.4    Complete by:  As directed    Diet - low sodium heart healthy    Complete by:  As directed    Increase activity slowly    Complete by:  As directed      Allergies as of 05/15/2017      Reactions  Ciprofloxacin    Other reaction(s): Other (See Comments) unknown   Lisinopril    Other reaction(s): Other (See Comments) Angioedema   Metformin    Other reaction(s): GI Upset (intolerance)   Oteracil    Other Other (See Comments)   Per son "blood dye for xrays" Reaction unknown      Medication List    TAKE these medications   aspirin 325 MG tablet Take 1 tablet (325 mg total) by mouth daily.   atorvastatin 80 MG tablet Commonly known as:  LIPITOR Take 1 tablet (80 mg  total) by mouth daily at 6 PM.   candesartan 32 MG tablet Commonly known as:  ATACAND Take 32 mg by mouth daily.   FLUoxetine 10 MG tablet Commonly known as:  PROZAC Take 10 mg by mouth daily.   imipramine 25 MG tablet Commonly known as:  TOFRANIL Take 50 mg by mouth at bedtime.   LEVEMIR FLEXPEN [place] Inject 30 Units into the skin 2 (two) times daily.   levothyroxine 100 MCG tablet Commonly known as:  SYNTHROID, LEVOTHROID Take 100 mcg by mouth daily before breakfast.            Discharge Care Instructions        Start     Ordered   05/16/17 0000  aspirin 325 MG tablet  Daily     05/15/17 1527   05/15/17 0000  atorvastatin (LIPITOR) 80 MG tablet  Daily-1800     05/15/17 1527   05/15/17 0000  Increase activity slowly     05/15/17 1527   05/15/17 0000  Diet - low sodium heart healthy     05/15/17 1527   05/15/17 0000  Call MD for:  temperature >100.4     05/15/17 1527   05/15/17 0000  Call MD for:  persistant nausea and vomiting     05/15/17 1527   05/15/17 0000  Call MD for:  severe uncontrolled pain     05/15/17 1527   05/15/17 0000  Call MD for:  difficulty breathing, headache or visual disturbances     05/15/17 1527   05/15/17 0000  Call MD for:  hives     05/15/17 1527   05/15/17 0000  Call MD for:  persistant dizziness or light-headedness     05/15/17 1527   05/15/17 0000  Call MD for:  extreme fatigue     05/15/17 1527     Follow-up Information    Micki Riley, MD. Schedule an appointment as soon as possible for a visit in 6 week(s).   Specialties:  Neurology, Radiology Why:  Stroke follow Up  Contact information: [address] [place] [address] [phone]        [facility]ness, [facility] Schedule an appointment as soon as possible for a visit in 1 week(s).   Why:  Hospital Follow Up  Contact information: [address] [place] [address] [phone]          Allergies  Allergen Reactions  .  Ciprofloxacin     Other reaction(s): Other (See Comments) unknown  . Lisinopril     Other reaction(s): Other (See Comments) Angioedema  . Metformin     Other reaction(s): GI Upset (intolerance)  . Oteracil   . Other Other (See Comments)    Per son "blood dye for xrays" Reaction unknown   Current Discharge Medication List    START taking these medications   Details  aspirin 325 MG tablet  Take 1 tablet (325 mg total) by mouth daily.    atorvastatin (LIPITOR) 80 MG tablet Take 1 tablet (80 mg total) by mouth daily at 6 PM. Qty: 30 tablet, Refills: 0      CONTINUE these medications which have NOT CHANGED   Details  candesartan (ATACAND) 32 MG tablet Take 32 mg by mouth daily.    FLUoxetine (PROZAC) 10 MG tablet Take 10 mg by mouth daily.    imipramine (TOFRANIL) 25 MG tablet Take 50 mg by mouth at bedtime.    Insulin Detemir (LEVEMIR FLEXPEN [place]) Inject 30 Units into the skin 2 (two) times daily.    levothyroxine (SYNTHROID, LEVOTHROID) 100 MCG tablet Take 100 mcg by mouth daily before breakfast.        Procedures/Studies: Dg Chest 2 View  Result Date: 05/14/2017 CLINICAL DATA:  Transient ischemic attack EXAM: CHEST  2 VIEW COMPARISON:  02/14/2017 FINDINGS: The heart size and mediastinal contours are within normal limits. Both lungs are clear. The visualized skeletal structures are unremarkable. IMPRESSION: No active cardiopulmonary disease. Electronically Signed   By: Tollie Eth M.D.   On: 05/14/2017 00:36   Mr Brain Wo Contrast  Result Date: 05/14/2017 CLINICAL DATA:  71 year old female with code stroke presentation a [facility]. Right upper extremity and right tongue numbness. EXAM: MRI HEAD WITHOUT CONTRAST MRA HEAD WITHOUT CONTRAST TECHNIQUE: Multiplanar, multiecho pulse sequences of the brain and surrounding structures were obtained without intravenous contrast. Angiographic images of the head were obtained using MRA technique without contrast. COMPARISON:   [place] noncontrast head CT 05/13/2017. FINDINGS: MRI HEAD FINDINGS Brain: Small 7 mm linear focus of restricted diffusion in the ventral and lateral left thalamus near the posterior limb of the left internal capsule (series 3, image 23). No significant T2 or FLAIR hyperintensity. No associated hemorrhage or mass effect. No other No restricted diffusion to suggest acute infarction. No midline shift, mass effect, evidence of mass lesion, ventriculomegaly, extra-axial collection or acute intracranial hemorrhage. Cervicomedullary junction and pituitary are within normal limits. Heterogeneity in the left basal ganglia (especially the left caudate) and right thalamus is suggestive of superimposed chronic lacunar infarcts. The brainstem and cerebellum are normal for age. Minimal cerebral white matter FLAIR hyperintensity. No cortical encephalomalacia or definite chronic cerebral blood products. No coronal T2 weighted imaging provided. Vascular: Major intracranial vascular flow voids are preserved. Skull and upper cervical spine: Negative. Normal bone marrow signal. Sinuses/Orbits: Negative. Paranasal sinuses are stable and well pneumatized. Other: Trace inferior left mastoid air cell fluid, significance doubtful. Negative nasopharynx. Visible internal auditory structures appear normal. Negative scalp soft tissues. MRA HEAD FINDINGS Antegrade flow in the posterior circulation with codominant distal vertebral arteries. No distal vertebral stenosis. Patent left PICA origin an dominant right AICA origin. Vertebrobasilar junction and basilar arteries are patent without stenosis. SCA and left PCA origins are patent. Fetal type right PCA origin is patent. Bilateral PCA branches are within normal limits. Left posterior communicating artery is diminutive or absent. Antegrade flow in both ICA siphons. Siphon irregularity in keeping with atherosclerosis. Mild to moderate right anterior genu stenosis. No significant left  siphon stenosis. Patent carotid termini. Normal MCA and ACA origins. Normal ophthalmic and right posterior communicating artery origins. Diminutive or absent anterior communicating artery. Visible ACA branches are normal. Left MCA M1 segment and visible left MCA branches are normal. There is early right M1 branching with mild irregularity of right MCA branches. IMPRESSION: 1. Small acute lacunar infarct in the left thalamus with no associated  hemorrhage or mass effect. 2. 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 No other acute intracranial abnormality. 3. Intracranial atherosclerosis with mild to moderate right ICA siphon stenosis, but otherwise negative for age intracranial MRA. Electronically Signed   By: Odessa Fleming M.D.   On: 05/14/2017 18:31   Mr Shirlee Latch ZH Contrast  Result Date: 05/14/2017 CLINICAL DATA:  71 year old female with code stroke presentation a [facility]. Right upper extremity and right tongue numbness. EXAM: MRI HEAD WITHOUT CONTRAST MRA HEAD WITHOUT CONTRAST TECHNIQUE: Multiplanar, multiecho pulse sequences of the brain and surrounding structures were obtained without intravenous contrast. Angiographic images of the head were obtained using MRA technique without contrast. COMPARISON:  [facility] noncontrast head CT 05/13/2017. FINDINGS: MRI HEAD FINDINGS Brain: Small 7 mm linear focus of restricted diffusion in the ventral and lateral left thalamus near the posterior limb of the left internal capsule (series 3, image 23). No significant T2 or FLAIR hyperintensity. No associated hemorrhage or mass effect. No other No restricted diffusion to suggest acute infarction. No midline shift, mass effect, evidence of mass lesion, ventriculomegaly, extra-axial collection or acute intracranial hemorrhage. Cervicomedullary junction and pituitary are within normal limits. Heterogeneity in the left basal ganglia (especially the left caudate) and right  thalamus is suggestive of superimposed chronic lacunar infarcts. The brainstem and cerebellum are normal for age. Minimal cerebral white matter FLAIR hyperintensity. No cortical encephalomalacia or definite chronic cerebral blood products. No coronal T2 weighted imaging provided. Vascular: Major intracranial vascular flow voids are preserved. Skull and upper cervical spine: Negative. Normal bone marrow signal. Sinuses/Orbits: Negative. Paranasal sinuses are stable and well pneumatized. Other: Trace inferior left mastoid air cell fluid, significance doubtful. Negative nasopharynx. Visible internal auditory structures appear normal. Negative scalp soft tissues. MRA HEAD FINDINGS Antegrade flow in the posterior circulation with codominant distal vertebral arteries. No distal vertebral stenosis. Patent left PICA origin an dominant right AICA origin. Vertebrobasilar junction and basilar arteries are patent without stenosis. SCA and left PCA origins are patent. Fetal type right PCA origin is patent. Bilateral PCA branches are within normal limits. Left posterior communicating artery is diminutive or absent. Antegrade flow in both ICA siphons. Siphon irregularity in keeping with atherosclerosis. Mild to moderate right anterior genu stenosis. No significant left siphon stenosis. Patent carotid termini. Normal MCA and ACA origins. Normal ophthalmic and right posterior communicating artery origins. Diminutive or absent anterior communicating artery. Visible ACA branches are normal. Left MCA M1 segment and visible left MCA branches are normal. There is early right M1 branching with mild irregularity of right MCA branches. IMPRESSION: 1. Small acute lacunar infarct in the left thalamus with no associated hemorrhage or mass effect. 2. 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 No other acute intracranial abnormality. 3. Intracranial atherosclerosis with mild to moderate right ICA siphon  stenosis, but otherwise negative for age intracranial MRA. Electronically Signed   By: Odessa Fleming M.D.   On: 05/14/2017 18:31      Subjective: Pt wants to go home.   Discharge Exam: Vitals:   05/15/17 0529 05/15/17 1505  BP: (!) 169/80 (!) 151/71  Pulse: 78 82  Resp: 18 17  Temp: 98.2 F (36.8 C) 98.2 F (36.8 C)  SpO2: 97% 99%   Vitals:   05/14/17 1500 05/14/17 2140 05/15/17 0529 05/15/17 1505  BP: (!) 159/84 (!) 148/49 (!) 169/80 (!) 151/71  Pulse: 79 84 78 82  Resp: 18 18 18  17  Temp: 98.2 F (36.8 C) (!) 97.4 F (36.3 C) 98.2 F (36.8 C) 98.2 F (36.8 C)  TempSrc: Oral Oral Oral Oral  SpO2: 100% 100% 97% 99%  Weight:      Height:       General: Pt is alert, awake, not in acute distress Cardiovascular: RRR, S1/S2 +, no rubs, no gallops Respiratory: CTA bilaterally, no wheezing, no rhonchi Abdominal: Soft, NT, ND, bowel sounds + Extremities: no edema, no cyanosis   The results of significant diagnostics from this hospitalization (including imaging, microbiology, ancillary and laboratory) are listed below for reference.     Microbiology: No results found for this or any previous visit (from the past 240 hour(s)).   Labs: BNP (last 3 results) No results for input(s): BNP in the last 8760 hours. Basic Metabolic Panel:  Recent Labs Lab 05/14/17 0112 05/15/17 0846  NA 139 141  K 2.5* 3.0*  CL 105 107  CO2 26 21*  GLUCOSE 172* 118*  BUN <5* 5*  CREATININE 1.04* 0.96  CALCIUM 8.9 9.1  MG 1.7 1.7   Liver Function Tests:  Recent Labs Lab 05/14/17 0112 05/15/17 0846  AST 25 24  ALT 16 16  ALKPHOS 49 46  BILITOT 0.5 0.6  PROT 5.7* 5.7*  ALBUMIN 3.1* 3.1*   No results for input(s): LIPASE, AMYLASE in the last 168 hours. No results for input(s): AMMONIA in the last 168 hours. CBC:  Recent Labs Lab 05/14/17 0112 05/15/17 0846  WBC 7.4 6.5  HGB 13.0 12.7  HCT 39.1 39.3  MCV 86.9 87.7  PLT 293 287   Cardiac Enzymes: No results for input(s):  CKTOTAL, CKMB, CKMBINDEX, TROPONINI in the last 168 hours. BNP: Invalid input(s): POCBNP CBG:  Recent Labs Lab 05/14/17 1200 05/14/17 1818 05/14/17 2136 05/15/17 0800 05/15/17 1220  GLUCAP 167* 144* 201* 109* 202*   D-Dimer No results for input(s): DDIMER in the last 72 hours. Hgb A1c  Recent Labs  05/14/17 0112  HGBA1C 8.2*   Lipid Profile  Recent Labs  05/14/17 0112  CHOL 257*  HDL 36*  LDLCALC 187*  TRIG 168*  CHOLHDL 7.1   Thyroid function studies No results for input(s): TSH, T4TOTAL, T3FREE, THYROIDAB in the last 72 hours.  Invalid input(s): FREET3 Anemia work up No results for input(s): VITAMINB12, FOLATE, FERRITIN, TIBC, IRON, RETICCTPCT in the last 72 hours. Urinalysis No results found for: COLORURINE, APPEARANCEUR, LABSPEC, PHURINE, GLUCOSEU, HGBUR, BILIRUBINUR, KETONESUR, PROTEINUR, UROBILINOGEN, NITRITE, LEUKOCYTESUR Sepsis Labs Invalid input(s): PROCALCITONIN,  WBC,  LACTICIDVEN Microbiology No results found for this or any previous visit (from the past 240 hour(s)).  Time coordinating discharge: 35 minutes   SIGNED:  Standley Dakins, MD  [facility] 05/15/2017, 3:29 PM Pager [phone]  If 7PM-7AM, please contact night-coverage [email or web address] Password [email or web address]

## 2017-05-15 NOTE — Progress Notes (Signed)
Physical Therapy Treatment Patient Details Name: Maria Thomas MRN: [id number] DOB: 06/16/1946 Today's Date: 05/15/2017    History of Present Illness Maria Thomas is a 71 y.o. female with history of diabetes mellitus, CAD status post stenting, hypertension, hypothyroidism had presented to the ER at [facility] with complaints of right upper extremity numbness and numbness in her tongue. Patient is a poor historian and history changes frequently.    PT Comments    Pt with improved cognition as pt is oriented to date, time and place. Pt with increased ambulation tolerance today. Pt declined HHPT to progress endurance and balance. Acute PT to con't to follow.   Follow Up Recommendations  No PT follow up;Supervision/Assistance - 24 hour     Equipment Recommendations  None recommended by PT    Recommendations for Other Services       Precautions / Restrictions Precautions Precautions: Fall Precaution Comments: SOB with activity Restrictions Weight Bearing Restrictions: No    Mobility  Bed Mobility Overal bed mobility: Modified Independent             General bed mobility comments: pt able to complete without physical help but was labored effort  Transfers Overall transfer level: Needs assistance Equipment used: None Transfers: Sit to/from Stand Sit to Stand: Supervision         General transfer comment: pt mildly unsteady but no episode of LOB  Ambulation/Gait Ambulation/Gait assistance: Min guard Ambulation Distance (Feet): 240 Feet Assistive device: None Gait Pattern/deviations: Step-through pattern Gait velocity: wfl Gait velocity interpretation: at or above normal speed for age/gender General Gait Details: pt with wide base of support, short step length and height, waddle type gait pattern. SpO2 >93% on RA. Pt with episode of LOB requiring modA to regain balance towards end of walk. pt with report "im starting to give out" "this is way more than I walk  at home"   Stairs            Wheelchair Mobility    Modified Rankin (Stroke Patients Only) Modified Rankin (Stroke Patients Only) Pre-Morbid Rankin Score: No significant disability Modified Rankin: Slight disability     Balance Overall balance assessment: Needs assistance             Standing balance comment: pt able to stand at sink and brush hair and brush teeth without difficulty                            Cognition Arousal/Alertness: Awake/alert Behavior During Therapy: Impulsive Overall Cognitive Status: No family/caregiver present to determine baseline cognitive functioning                                 General Comments: pt found getting up trying to go to bathroom on own. Pt with no concept of bed alarm.      Exercises      General Comments        Pertinent Vitals/Pain Pain Assessment: No/denies pain    Home Living                      Prior Function            PT Goals (current goals can now be found in the care plan section) Progress towards PT goals: Progressing toward goals    Frequency    Min 3X/week  PT Plan Current plan remains appropriate    Co-evaluation              AM-PAC PT "6 Clicks" Daily Activity  Outcome Measure  Difficulty turning over in bed (including adjusting bedclothes, sheets and blankets)?: None Difficulty moving from lying on back to sitting on the side of the bed? : None Difficulty sitting down on and standing up from a chair with arms (e.g., wheelchair, bedside commode, etc,.)?: None Help needed moving to and from a bed to chair (including a wheelchair)?: A Little Help needed walking in hospital room?: A Little Help needed climbing 3-5 steps with a railing? : A Little 6 Click Score: 21    End of Session Equipment Utilized During Treatment: Gait belt Activity Tolerance: Patient tolerated treatment well Patient left: in chair;with call bell/phone within  reach;with chair alarm set;with nursing/sitter in room Nurse Communication: Mobility status PT Visit Diagnosis: Unsteadiness on feet (R26.81)     Time: [id number] PT Time Calculation (min) (ACUTE ONLY): 16 min  Charges:  $Gait Training: 8-22 mins                    G Codes:       Lewis ShockAshly Deepti Gunawan, PT, DPT Pager #: [phone] Office #: [phone]    Marlow Berenguer M Lizandro Spellman 05/15/2017, 11:00 AM

## 2017-05-15 NOTE — Progress Notes (Signed)
STROKE TEAM PROGRESS NOTE   HISTORY OF PRESENT ILLNESS (per record) Maria Thomas is an 71 y.o. female CAD, DM and HTN with R arm Dupuytren's contracture who presented to [facility] for urinary symptoms and lower abdominal pain.  She stated that on 9/1 she felt weak in her right arm for most of the day- she thought it was because she used it more than normal for her hobby of completing jigsaw puzzles. Her symptoms resolved and she denies any sudden onset weakness in any one extremity, denies unilateral numbness, loss of vision, slurring of speech, facial droop or gait imbalance.   She was transferred to [facility] for TIA workup.    Patient was not administered IV t-PA secondary to resolution of symptoms . She was admitted to General Neurology for further evaluation and treatment.   SUBJECTIVE (INTERVAL HISTORY) No family is at the bedside.  The patient is awake, alert, and follows all commands appropriately.  Right hand weakness is improving. She wants to go home OBJECTIVE Temp:  [97.4 F (36.3 C)-98.2 F (36.8 C)] 98.2 F (36.8 C) (09/04 1505) Pulse Rate:  [78-84] 82 (09/04 1505) Cardiac Rhythm: Normal sinus rhythm (09/04 0700) Resp:  [17-18] 17 (09/04 1505) BP: (148-169)/(49-80) 151/71 (09/04 1505) SpO2:  [97 %-100 %] 99 % (09/04 1505)  CBC:   Recent Labs Lab 05/14/17 0112 05/15/17 0846  WBC 7.4 6.5  HGB 13.0 12.7  HCT 39.1 39.3  MCV 86.9 87.7  PLT 293 287    Basic Metabolic Panel:   Recent Labs Lab 05/14/17 0112 05/15/17 0846  NA 139 141  K 2.5* 3.0*  CL 105 107  CO2 26 21*  GLUCOSE 172* 118*  BUN <5* 5*  CREATININE 1.04* 0.96  CALCIUM 8.9 9.1  MG 1.7 1.7    Lipid Panel:     Component Value Date/Time   CHOL 257 (H) 05/14/2017 0112   TRIG 168 (H) 05/14/2017 0112   HDL 36 (L) 05/14/2017 0112   CHOLHDL 7.1 05/14/2017 0112   VLDL 34 05/14/2017 0112   LDLCALC 187 (H) 05/14/2017 0112   HgbA1c:  Lab Results  Component Value Date   HGBA1C 8.2  (H) 05/14/2017   Urine Drug Screen: No results found for: LABOPIA, COCAINSCRNUR, LABBENZ, AMPHETMU, THCU, LABBARB  Alcohol Level No results found for: [facility]  IMAGING  Dg Chest 2 View 05/14/2017 IMPRESSION: No active cardiopulmonary disease.   Mr Brain 13 Contrast Mr Maxine Glenn Head Wo Contrast 05/14/2017 IMPRESSION: 1. Small acute lacunar infarct in the left thalamus with no associated hemorrhage or mass effect. 2. 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 No other acute intracranial abnormality. 3. Intracranial atherosclerosis with mild to moderate right ICA siphon stenosis, but otherwise negative for age intracranial MRA.  Carotid US 05/14/2017 Summary: Right: intimal wall thickening CCA. Mild to moderate calcific plaque origin and proximal ICA with acoustic shadowing. Tortuous distal ICA. Left: intimal wall thickening CCA. Mild mixed plaque origin and proximal ICA. Bilateral: 1-39% ICA stenosis. Vertebral artery flow is antegrade.  TTE 05/14/2017 Study Conclusions - Left ventricle: The cavity size was normal. Wall thickness was normal. Systolic function was normal. The estimated ejection fraction was in the range of 55% to 60%. Wall motion was normal; there were no regional wall motion abnormalities. - Mitral valve: Calcified annulus. - Left atrium: The atrium was mildly dilated.  PHYSICAL EXAM : Pleasant elderly Caucasian lady currently not in distress   . Afebrile. Head is nontraumatic.  Neck is supple without bruit.    Cardiac exam no murmur or gallop. Lungs are clear to auscultation. Distal pulses are well felt.  Neurological Exam ;  Awake  Alert oriented x 3. Normal speech and language.eye movements full without nystagmus.fundi were not visualized. Vision acuity and fields appear normal. Hearing is normal. Palatal movements are normal. Face symmetric. Tongue midline. Normal strength, tone, reflexes and coordination except diminished fine finger  movements on the right. Orbits left over right upper extremity. Mild weakness of right grip.. Normal sensation. Gait deferred.  ASSESSMENT/PLAN Ms. Clois ComberLinda S Hankins is a 71 y.o. female with history of CAD, DM and HTN with R arm Dupuytren's contracture who presented to [facility] for urinary symptoms and lower abdominal pain.  She stated that on 9/1 she felt weak in her right arm for most of the day- she thought it was because she used it more than normal for her hobby of completing jigsaw puzzles. She did not receive IV t-PA due to rapid resolution of symptoms.   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  Resultant  mild weakness in the right hand  CT head: no acute stroke.  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  MRI head: Small acute lacunar infarct in the left thalamus with no associated hemorrhage or mass effect.   MRA head: No LVO or high-grade stenosis.  Moderate R ICA stenosis.  Atherosclerosis.  Carotid Doppler: B ICA 1-39% stenosis, VAs antegrade  2D Echo: EF 55-60%. No source of embolus   LDL 187  HgbA1c 8.2  for VTE prophylaxis Diet heart healthy/carb modified Room service appropriate? Yes; Fluid consistency: Thin Diet - low sodium heart healthy  No antithrombotic prior to admission, now on aspirin 325 mg daily  Patient counseled to be compliant with her antithrombotic medications  Ongoing aggressive stroke risk factor management  Therapy recommendations: None  Disposition: None  Hypertension  Stable  Permissive hypertension (OK if < 220/120) but gradually normalize in 5-7 days  Long-term BP goal normotensive  Hyperlipidemia  Home meds:  none  LDL 187, goal < 70  Add atorvastatin 80 mg PO daily  Continue statin at discharge  Diabetes  HgbA1c 8.2, goal <  7.0  Uncontrolled  Diabetes Coordinator consulted  Other Stroke Risk Factors  Advanced age  Obesity, Body mass index is 35.72 kg/m., recommend weight loss, diet and exercise as appropriate   Coronary artery disease  Other Active Problems  None  Hospital day # 2 I have personally examined this patient, reviewed notes, independently viewed imaging studies, participated in medical decision making and plan of care.ROS completed by me personally and pertinent positives fully documented  I have made any additions or clarifications directly to the above note.  She presented with right arm weakness secondary to lacunar left thalamic infarct from small vessel disease. Continue aspirin and Lipitor and aggressive risk factor modification and discharge home. Follow-up as an outpatient in stroke clinic in 6 weeks. Discuss with Dr. Filbert BertholdJohnson  Pramod Sethi, MD Medical Director [facility] Pager: [phone] 05/15/2017 4:36 PM   To contact Stroke Continuity provider, please refer to [email or web address]. After hours, contact General Neurology

## 2017-05-15 NOTE — Progress Notes (Signed)
Met with patient and her son after receiving an inpatient referral to discuss elevated A1C of 8.2%.  Neither the patient or her son knows what an A1C is and her son tells me that the MD wants to keep her blood sugars around 227m/dl since the patient "has no energy when the blood sugars are around 153mdl".  I discussed the importance of a lower A1C to decrease the risk of repeat heart attack (15 years ago) , stroke, kidney failure and amputation.    We discussed the importance of insulin.  Her son tells me, fasting blood sugars are around 11575ml but post supper blood sugar is often greater than 200m34m (he has a notebook with all her blood sugars with him).  I discussed the use of long and acting and short acting mealtime insulin.  Son tells me they have never used mealtime insulin at home. I have encouraged them to keep fasting blood sugars and 2 hour post meal and have a conversation with the MD, the next time they visit with the doctor.   Reviewed the importance of a  low fat, low sodium diet.  They eat every single meal from a fast food restaurant.  Suggested they pick up a nutrition guideline from each restaurant and look for the low fat and low sodium options.  She only eats twice a day and I have suggested they try to eat based on the plate method to include carbohydrate, protein and vegetables at each meal.  This patient has been "throwing out the fries and the bread from the sandwich" and just eating the meat.  I stressed the importance of a well balanced diet which includes a starch at each meal.   I have recommended they may want to consider Weight Watcher or Lean Cuisine meals as they tend to keep the sodium , fat and carbohydrate in a moderate range.  I have suggested they consider meeting with a Registered Dietitian for additional information.  Her son was open to trying pre-made meals but he thought they "weren't good for her".  He thought this was an easy, healthier, option which will offer  her some variety.  I have ordered the Living Well with Diabetes book for them as a reference- there is a "plate method" guide they can follow.   May want to consider Novolog 2 units bid with breakfast and supper at discharge, along with her Levemir. .   Gentry Fitz, BA, MHA, CDE Diabetes Coordinator Inpatient Diabetes Program  [phone] Pager) [phone]/2018 2:37 PM

## 2017-05-15 NOTE — Discharge Instructions (Signed)
Follow with Primary MD  No primary care provider on file.  and other consultant's as instructed your Hospitalist MD  Please get a complete blood count and chemistry panel checked by your Primary MD at your next visit, and again as instructed by your Primary MD.  Get Medicines reviewed and adjusted: Please take all your medications with you for your next visit with your Primary MD  Laboratory/radiological data: Please request your Primary MD to go over all hospital tests and procedure/radiological results at the follow up, please ask your Primary MD to get all Hospital records sent to his/her office.  In some cases, they will be blood work, cultures and biopsy results pending at the time of your discharge. Please request that your primary care M.D. follows up on these results.  Also Note the following: If you experience worsening of your admission symptoms, develop shortness of breath, life threatening emergency, suicidal or homicidal thoughts you must seek medical attention immediately by calling 911 or calling your MD immediately  if symptoms less severe.  You must read complete instructions/literature along with all the possible adverse reactions/side effects for all the Medicines you take and that have been prescribed to you. Take any new Medicines after you have completely understood and accpet all the possible adverse reactions/side effects.   Do not drive when taking Pain medications or sleeping medications (Benzodaizepines)  Do not take more than prescribed Pain, Sleep and Anxiety Medications. It is not advisable to combine anxiety,sleep and pain medications without talking with your primary care practitioner  Special Instructions: If you have smoked or chewed Tobacco  in the last 2 yrs please stop smoking, stop any regular Alcohol  and or any Recreational drug use.  Wear Seat belts while driving.  Please note: You were cared for by a hospitalist during your hospital stay. Once you  are discharged, your primary care physician will handle any further medical issues. Please note that NO REFILLS for any discharge medications will be authorized once you are discharged, as it is imperative that you return to your primary care physician (or establish a relationship with a primary care physician if you do not have one) for your post hospital discharge needs so that they can reassess your need for medications and monitor your lab values.      Aspirin and Your Heart Aspirin is a medicine that affects the way blood clots. Aspirin can be used to help reduce the risk of blood clots, heart attacks, and other heart-related problems. Should I take aspirin? Your health care provider will help you determine whether it is safe and beneficial for you to take aspirin daily. Taking aspirin daily may be beneficial if you:  Have had a heart attack or chest pain.  Have undergone open heart surgery such as coronary artery bypass surgery (CABG).  Have had coronary angioplasty.  Have experienced a stroke or transient ischemic attack (TIA).  Have peripheral vascular disease (PVD).  Have chronic heart rhythm problems such as atrial fibrillation.  Are there any risks of taking aspirin daily? Daily use of aspirin can increase your risk of side effects. Some of these include:  Bleeding. Bleeding problems can be minor or serious. An example of a minor problem is a cut that does not stop bleeding. An example of a more serious problem is stomach bleeding or bleeding into the brain. Your risk of bleeding is increased if you are also taking non-steroidal anti-inflammatory medicine (NSAIDs).  Increased bruising.  Upset stomach.  An allergic  reaction. People who have nasal polyps have an increased risk of developing an aspirin allergy.  What are some guidelines I should follow when taking aspirin?  Take aspirin only as directed by your health care provider. Make sure you understand how much you  should take and what form you should take. The two forms of aspirin are: ? Non-enteric-coated. This type of aspirin does not have a coating and is absorbed quickly. Non-enteric-coated aspirin is usually recommended for people with chest pain. This type of aspirin also comes in a chewable form. ? Enteric-coated. This type of aspirin has a special coating that releases the medicine very slowly. Enteric-coated aspirin causes less stomach upset than non-enteric-coated aspirin. This type of aspirin should not be chewed or crushed.  Drink alcohol in moderation. Drinking alcohol increases your risk of bleeding. When should I seek medical care?  You have unusual bleeding or bruising.  You have stomach pain.  You have an allergic reaction. Symptoms of an allergic reaction include: ? Hives. ? Itchy skin. ? Swelling of the lips, tongue, or face.  You have ringing in your ears. When should I seek immediate medical care?  Your bowel movements are bloody, dark red, or black in color.  You vomit or cough up blood.  You have blood in your urine.  You cough, wheeze, or feel short of breath. If you have any of the following symptoms, this is an emergency. Do not wait to see if the pain will go away. Get medical help at once. Call your local emergency services (911 in the [place] Do not drive yourself to the hospital.  You have severe chest pain, especially if the pain is crushing or pressure-like and spreads to the arms, back, neck, or jaw.  You have stroke-like symptoms, such as: ? Loss of vision. ? Difficulty talking. ? Numbness or weakness on one side of your body. ? Numbness or weakness in your arm or leg. ? Not thinking clearly or feeling confused.  This information is not intended to replace advice given to you by your health care provider. Make sure you discuss any questions you have with your health care provider. Document Released: 08/10/2008 Document Revised: 01/05/2016 Document  Reviewed: 12/03/2013 Elsevier Interactive Patient Education  2018 [employer].

## 2017-06-21 ENCOUNTER — Ambulatory Visit: Payer: Self-pay | Admitting: Neurology

## 2017-08-13 ENCOUNTER — Ambulatory Visit (INDEPENDENT_AMBULATORY_CARE_PROVIDER_SITE_OTHER): Payer: Medicare Other | Admitting: Neurology

## 2017-08-13 ENCOUNTER — Encounter: Payer: Self-pay | Admitting: Neurology

## 2017-08-13 VITALS — BP 158/84 | HR 92 | Ht 60.0 in | Wt 169.0 lb

## 2017-08-13 DIAGNOSIS — I6381 Other cerebral infarction due to occlusion or stenosis of small artery: Secondary | ICD-10-CM

## 2017-08-13 DIAGNOSIS — I639 Cerebral infarction, unspecified: Secondary | ICD-10-CM | POA: Diagnosis not present

## 2017-08-13 NOTE — Progress Notes (Signed)
[facility] [address] [place]. [place] [address] [phone]       OFFICE FOLLOW-UP NOTE  [name] Date of Birth:  01/30/1946 Medical Record Number:  [id number]   HPI: Maria Thomas is a 71 year old Caucasian lady is seen today for first office follow-up visit following hospital admission for stroke in September 2018. She is accompanied by her son today. History is obtained from them as well as review of hospital medical records. I personally reviewed imaging films. Maria Thomas an 71 y.o.femaleCAD, DM and HTN with R arm Dupuytren's contracture who presented to [facility] for urinary symptoms and lower abdominal pain. She stated that on 9/1 she felt weak in her right arm for most of the day- she thought it was because she used it more than normal for her hobby of completing jigsaw puzzles. Her symptoms resolved and she denies any sudden onset weakness in any one extremity, denies unilateral numbness, loss of vision, slurring of speech, facial droop or gait imbalance. She was transferred to [facility] for TIA workup. Patient was not administered IV t-PA secondary to resolution of symptoms . She was admitted to General Neurology for further evaluation and treatment. MRI scan of the brain showed a small left thalamic lacunar infarct and changes of small vessel disease. Carotid ultrasound showed no significant extracranial stenosis. Transthoracic echo showed normal ejection fraction without cardiac source of embolism. LDL cholesterol was elevated at 187 mg percent and hemoglobin A1c at 8.2. Patient was started on aspirin for stroke prevention and Lipitor. She states she's done well since discharge and is tolerating aspirin well without significant bleeding or bruising and Lipitor without muscle aches and pains. She is regaining strength in the right hand but unfortunately had a fall at home in October. She tripped on a long pajama bottoms and fell face forward.  She sustained a significant bruise on her face and lips and fractured her right humerus. She was admitted to [facility] and was discharged on wearing a shoulder sling. She is doing better now and getting outpatient therapy. She states her blood pressure is well controlled though she does not check it regularly but today it is elevated in office in 158/84. Her fasting sugars range between 100-120. She did have lab work done a month ago by primary physician but I do not have those results. She is tolerating Lipitor well without muscle aches and pains. She still has some right hand weakness but this is old related to Dupuytren's contracture.   ROS:   14 system review of systems is positive for  ` incontinence, memory loss, confusion, anxiety, depression, change in appetite, imbalance and all other systems negative  PMH:  Past Medical History:  Diagnosis Date  . Coronary artery disease   . Diabetes mellitus without complication (HCC)   . Hypertension   . Myocardial infarct (HCC)   . Stroke [facility])     Social History:  Social History   Socioeconomic History  . Marital status: Married    Spouse name: Not on file  . Number of children: Not on file  . Years of education: Not on file  . Highest education level: Not on file  Social Needs  . Financial resource strain: Not on file  . Food insecurity - worry: Not on file  . Food insecurity - inability: Not on file  . Transportation needs - medical: Not on file  . Transportation needs - non-medical: Not on file  Occupational History  .  Not on file  Tobacco Use  . Smoking status: Former Games developermoker  . Smokeless tobacco: Never Used  Substance and Sexual Activity  . Alcohol use: No  . Drug use: No  . Sexual activity: Not on file  Other Topics Concern  . Not on file  Social History Narrative  . Not on file    Medications:   Current Outpatient Medications on File Prior to Visit  Medication Sig Dispense Refill  . aspirin 325 MG tablet  Take 1 tablet (325 mg total) by mouth daily.    Marland Kitchen. atorvastatin (LIPITOR) 80 MG tablet Take 1 tablet (80 mg total) by mouth daily at 6 PM. 30 tablet 0  . candesartan (ATACAND) 32 MG tablet Take 32 mg by mouth daily.    Marland Kitchen. FLUoxetine (PROZAC) 10 MG tablet Take 10 mg by mouth daily.    Marland Kitchen. imipramine (TOFRANIL) 25 MG tablet Take 50 mg by mouth at bedtime.    . Insulin Detemir (LEVEMIR FLEXPEN [place]) Inject 33 Units into the skin 2 (two) times daily.     Marland Kitchen. levothyroxine (SYNTHROID, LEVOTHROID) 100 MCG tablet Take 100 mcg by mouth daily before breakfast.     No current facility-administered medications on file prior to visit.     Allergies:   Allergies  Allergen Reactions  . Ciprofloxacin     Other reaction(s): Other (See Comments) unknown  . Lisinopril     Other reaction(s): Other (See Comments) Angioedema  . Metformin     Other reaction(s): GI Upset (intolerance)  . Oteracil   . Other Other (See Comments)    Per son "blood dye for xrays" Reaction unknown    Physical Exam General: well developed, well nourished elderly Caucasian lady, seated, in no evident distress Head: head normocephalic and atraumatic.  Neck: supple with no carotid or supraclavicular bruits Cardiovascular: regular rate and rhythm, no murmurs Musculoskeletal: Right hand middle finger flexion deformity due to Dupuytren's contracture Skin:  no rash/petichiae Vascular:  Normal pulses all extremities Vitals:   08/13/17 1432  BP: (!) 158/84  Pulse: 92   Neurologic Exam Mental Status: Awake and fully alert. Oriented to place and time. Recent and remote memory intact. Attention span, concentration and fund of knowledge appropriate. Mood and affect appropriate.  Cranial Nerves: Fundoscopic exam reveals sharp disc margins. Pupils equal, briskly reactive to light. Extraocular movements full without nystagmus. Visual fields full to confrontation. Hearing intact. Facial sensation intact. Face, tongue, palate moves normally and  symmetrically.  Motor: Normal bulk and tone. Normal strength in all tested extremity muscles. Diminished fine finger movements on the right. Orbits left over right upper extremity. Sensory.: intact to touch ,pinprick .position and vibratory sensation.  Coordination: Rapid alternating movements normal in all extremities. Finger-to-nose and heel-to-shin performed accurately bilaterally. Gait and Station: Arises from chair without difficulty. Stance is normal. Gait demonstrates normal stride length and balance . Able to heel, toe and tandem walk with mild difficulty.  Reflexes: 1+ and symmetric. Toes downgoing.   NIHSS  0 Modified Rankin 2   ASSESSMENT: 71 year old lady with left thalamic lacunar infarct in September 2018 from small vessel disease. Vascular risk factors of hypertension, hyperlipidemia and diabetes.    PLAN: I had a long d/w patient and her son about her recent lacunar thalamic stroke, risk for recurrent stroke/TIAs, personally independently reviewed imaging studies and stroke evaluation results and answered questions.Continue aspirin 325 mg daily  for secondary stroke prevention and maintain strict control of hypertension with blood pressure goal below 130/90, diabetes  with hemoglobin A1c goal below 6.5% and lipids with LDL cholesterol goal below 70 mg/dL. I also advised the patient to eat a healthy diet with plenty of whole grains, cereals, fruits and vegetables, exercise regularly and maintain ideal body weight . We also discussed fall and safety prevention precautions. Followup in the future with my nurse practitioner in 6 months or call earlier if necessary Greater than 50% of time during this 25 minute visit was spent on counseling,explanation of diagnosis of lacunar stroke, planning of further management, discussion with patient and family and coordination of care Delia HeadyPramod Amaziah Raisanen, MD  [facility] [address] [place], [place] [id number]  Phone  [phone] Fax [phone] Note: This document was prepared with digital dictation and possible smart phrase technology. Any transcriptional errors that result from this process are unintentional

## 2017-08-13 NOTE — Patient Instructions (Signed)
I had a long d/w patient and her son about her recent lacunar thalamic stroke, risk for recurrent stroke/TIAs, personally independently reviewed imaging studies and stroke evaluation results and answered questions.Continue aspirin 325 mg daily  for secondary stroke prevention and maintain strict control of hypertension with blood pressure goal below 130/90, diabetes with hemoglobin A1c goal below 6.5% and lipids with LDL cholesterol goal below 70 mg/dL. I also advised the patient to eat a healthy diet with plenty of whole grains, cereals, fruits and vegetables, exercise regularly and maintain ideal body weight . We also discussed fall and safety prevention precautions. Followup in the future with my nurse practitioner in 6 months or call earlier if necessary  Fall Prevention in the Home Falls can cause injuries. They can happen to people of all ages. There are many things you can do to make your home safe and to help prevent falls. What can I do on the outside of my home?  Regularly fix the edges of walkways and driveways and fix any cracks.  Remove anything that might make you trip as you walk through a door, such as a raised step or threshold.  Trim any bushes or trees on the path to your home.  Use bright outdoor lighting.  Clear any walking paths of anything that might make someone trip, such as rocks or tools.  Regularly check to see if handrails are loose or broken. Make sure that both sides of any steps have handrails.  Any raised decks and porches should have guardrails on the edges.  Have any leaves, snow, or ice cleared regularly.  Use sand or salt on walking paths during winter.  Clean up any spills in your garage right away. This includes oil or grease spills. What can I do in the bathroom?  Use night lights.  Install grab bars by the toilet and in the tub and shower. Do not use towel bars as grab bars.  Use non-skid mats or decals in the tub or shower.  If you need to  sit down in the shower, use a plastic, non-slip stool.  Keep the floor dry. Clean up any water that spills on the floor as soon as it happens.  Remove soap buildup in the tub or shower regularly.  Attach bath mats securely with double-sided non-slip rug tape.  Do not have throw rugs and other things on the floor that can make you trip. What can I do in the bedroom?  Use night lights.  Make sure that you have a light by your bed that is easy to reach.  Do not use any sheets or blankets that are too big for your bed. They should not hang down onto the floor.  Have a firm chair that has side arms. You can use this for support while you get dressed.  Do not have throw rugs and other things on the floor that can make you trip. What can I do in the kitchen?  Clean up any spills right away.  Avoid walking on wet floors.  Keep items that you use a lot in easy-to-reach places.  If you need to reach something above you, use a strong step stool that has a grab bar.  Keep electrical cords out of the way.  Do not use floor polish or wax that makes floors slippery. If you must use wax, use non-skid floor wax.  Do not have throw rugs and other things on the floor that can make you trip. What can I  do with my stairs?  Do not leave any items on the stairs.  Make sure that there are handrails on both sides of the stairs and use them. Fix handrails that are broken or loose. Make sure that handrails are as long as the stairways.  Check any carpeting to make sure that it is firmly attached to the stairs. Fix any carpet that is loose or worn.  Avoid having throw rugs at the top or bottom of the stairs. If you do have throw rugs, attach them to the floor with carpet tape.  Make sure that you have a light switch at the top of the stairs and the bottom of the stairs. If you do not have them, ask someone to add them for you. What else can I do to help prevent falls?  Wear shoes that: ? Do not  have high heels. ? Have rubber bottoms. ? Are comfortable and fit you well. ? Are closed at the toe. Do not wear sandals.  If you use a stepladder: ? Make sure that it is fully opened. Do not climb a closed stepladder. ? Make sure that both sides of the stepladder are locked into place. ? Ask someone to hold it for you, if possible.  Clearly mark and make sure that you can see: ? Any grab bars or handrails. ? First and last steps. ? Where the edge of each step is.  Use tools that help you move around (mobility aids) if they are needed. These include: ? Canes. ? Walkers. ? Scooters. ? Crutches.  Turn on the lights when you go into a dark area. Replace any light bulbs as soon as they burn out.  Set up your furniture so you have a clear path. Avoid moving your furniture around.  If any of your floors are uneven, fix them.  If there are any pets around you, be aware of where they are.  Review your medicines with your doctor. Some medicines can make you feel dizzy. This can increase your chance of falling. Ask your doctor what other things that you can do to help prevent falls. This information is not intended to replace advice given to you by your health care provider. Make sure you discuss any questions you have with your health care provider. Document Released: 06/24/2009 Document Revised: 02/03/2016 Document Reviewed: 10/02/2014 Elsevier Interactive Patient Education  2018 [employer].  Stroke Prevention Some medical conditions and behaviors are associated with an increased chance of having a stroke. You may prevent a stroke by making healthy choices and managing medical conditions. How can I reduce my risk of having a stroke?  Stay physically active. Get at least 30 minutes of activity on most or all days.  Do not smoke. It may also be helpful to avoid exposure to secondhand smoke.  Limit alcohol use. Moderate alcohol use is considered to be: ? No more than 2 drinks per  day for men. ? No more than 1 drink per day for nonpregnant women.  Eat healthy foods. This involves: ? Eating 5 or more servings of fruits and vegetables a day. ? Making dietary changes that address high blood pressure (hypertension), high cholesterol, diabetes, or obesity.  Manage your cholesterol levels. ? Making food choices that are high in fiber and low in saturated fat, trans fat, and cholesterol may control cholesterol levels. ? Take any prescribed medicines to control cholesterol as directed by your health care provider.  Manage your diabetes. ? Controlling your carbohydrate and sugar  intake is recommended to manage diabetes. ? Take any prescribed medicines to control diabetes as directed by your health care provider.  Control your hypertension. ? Making food choices that are low in salt (sodium), saturated fat, trans fat, and cholesterol is recommended to manage hypertension. ? Ask your health care provider if you need treatment to lower your blood pressure. Take any prescribed medicines to control hypertension as directed by your health care provider. ? If you are 4418-71 years of age, have your blood pressure checked every 3-5 years. If you are 71 years of age or older, have your blood pressure checked every year.  Maintain a healthy weight. ? Reducing calorie intake and making food choices that are low in sodium, saturated fat, trans fat, and cholesterol are recommended to manage weight.  Stop drug abuse.  Avoid taking birth control pills. ? Talk to your health care provider about the risks of taking birth control pills if you are over 49 years old, smoke, get migraines, or have ever had a blood clot.  Get evaluated for sleep disorders (sleep apnea). ? Talk to your health care provider about getting a sleep evaluation if you snore a lot or have excessive sleepiness.  Take medicines only as directed by your health care provider. ? For some people, aspirin or blood thinners  (anticoagulants) are helpful in reducing the risk of forming abnormal blood clots that can lead to stroke. If you have the irregular heart rhythm of atrial fibrillation, you should be on a blood thinner unless there is a good reason you cannot take them. ? Understand all your medicine instructions.  Make sure that other conditions (such as anemia or atherosclerosis) are addressed. Get help right away if:  You have sudden weakness or numbness of the face, arm, or leg, especially on one side of the body.  Your face or eyelid droops to one side.  You have sudden confusion.  You have trouble speaking (aphasia) or understanding.  You have sudden trouble seeing in one or both eyes.  You have sudden trouble walking.  You have dizziness.  You have a loss of balance or coordination.  You have a sudden, severe headache with no known cause.  You have new chest pain or an irregular heartbeat. Any of these symptoms may represent a serious problem that is an emergency. Do not wait to see if the symptoms will go away. Get medical help at once. Call your local emergency services (911 in [place] Do not drive yourself to the hospital. This information is not intended to replace advice given to you by your health care provider. Make sure you discuss any questions you have with your health care provider. Document Released: 10/05/2004 Document Revised: 02/03/2016 Document Reviewed: 02/28/2013 Elsevier Interactive Patient Education  2017 [employer]

## 2018-02-11 ENCOUNTER — Ambulatory Visit: Payer: Medicare Other | Admitting: Nurse Practitioner

## 2018-07-12 IMAGING — DX DG CHEST 2V
2 series · 2 of 2 positions shown · non-contrast
Comparison: 02/14/2017

CLINICAL DATA: Transient ischemic attack

EXAM:
CHEST  2 VIEW

[w chest pa]
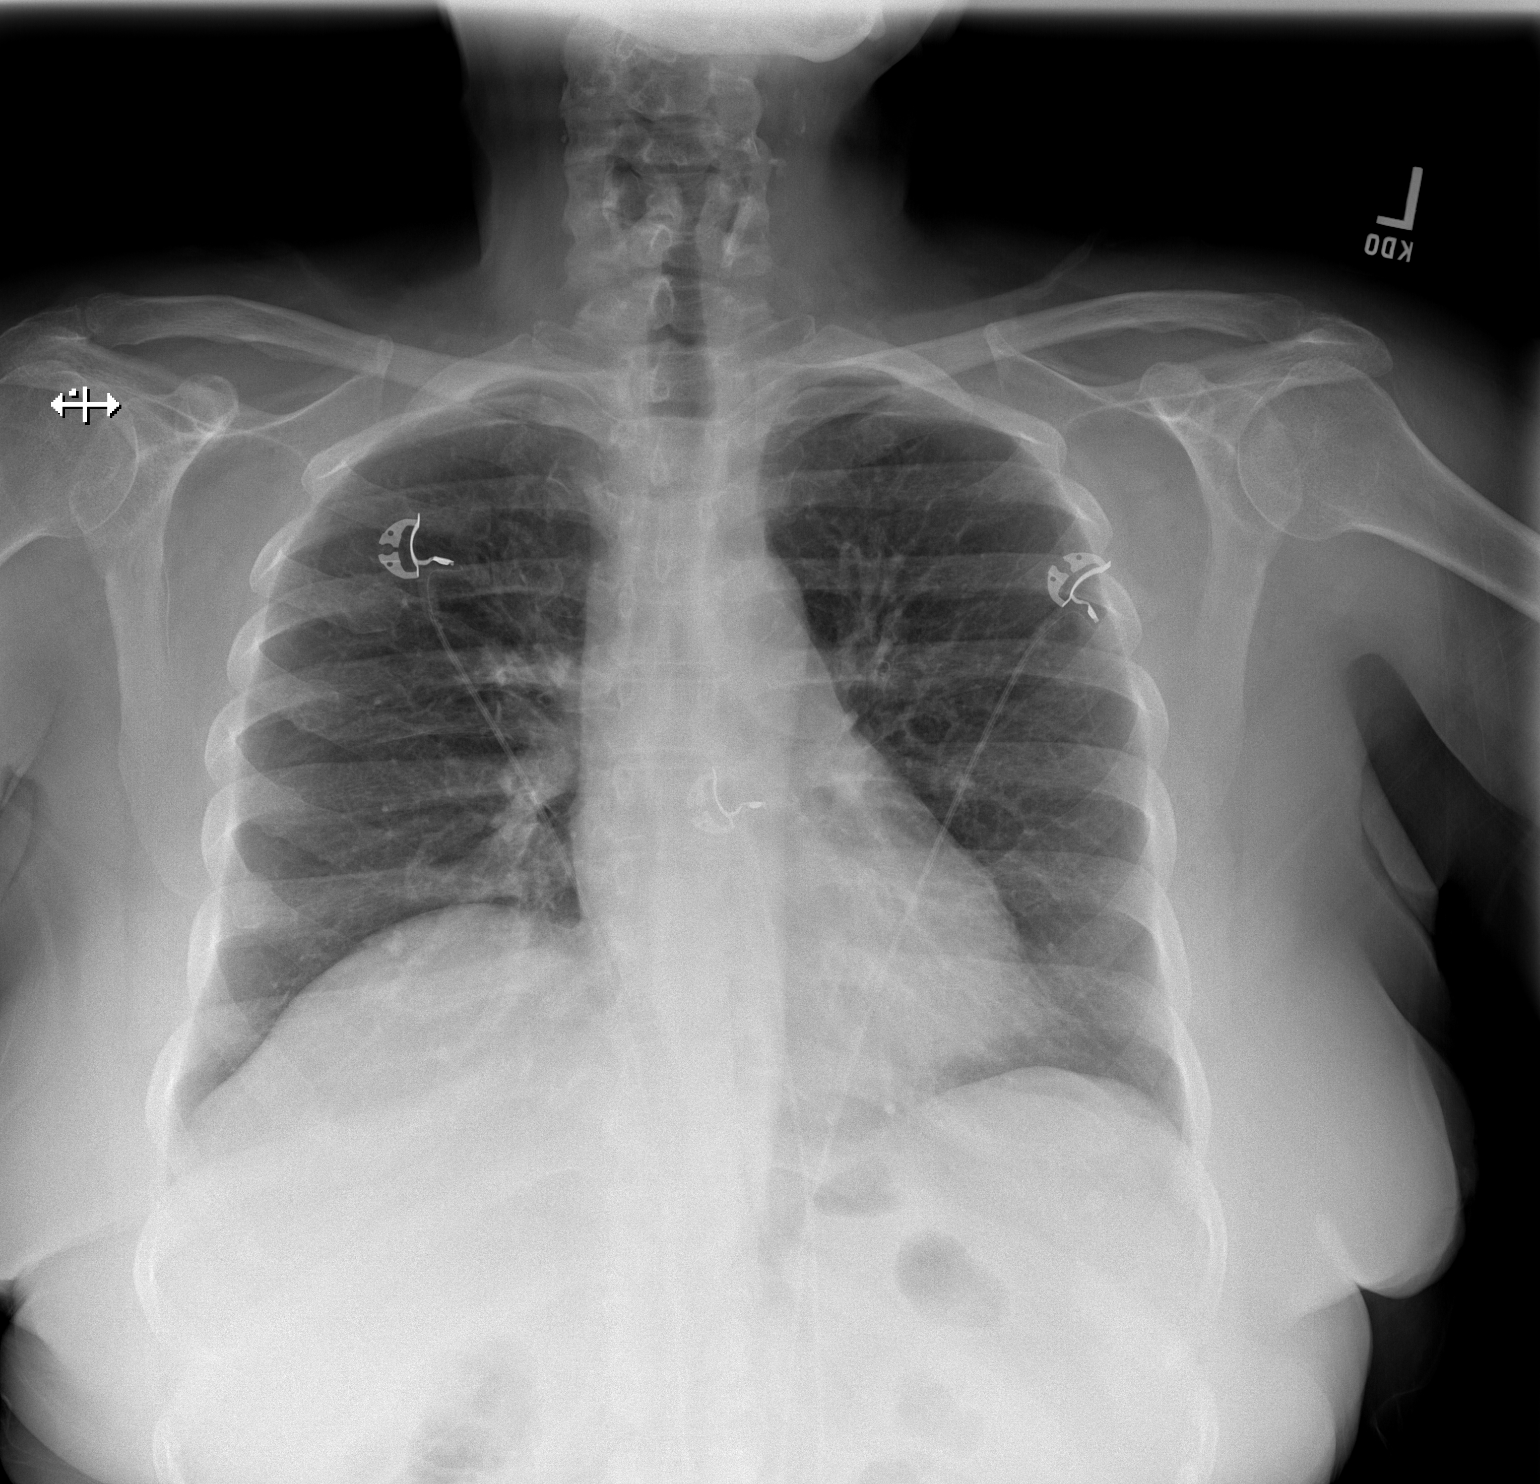

[w chest lat]
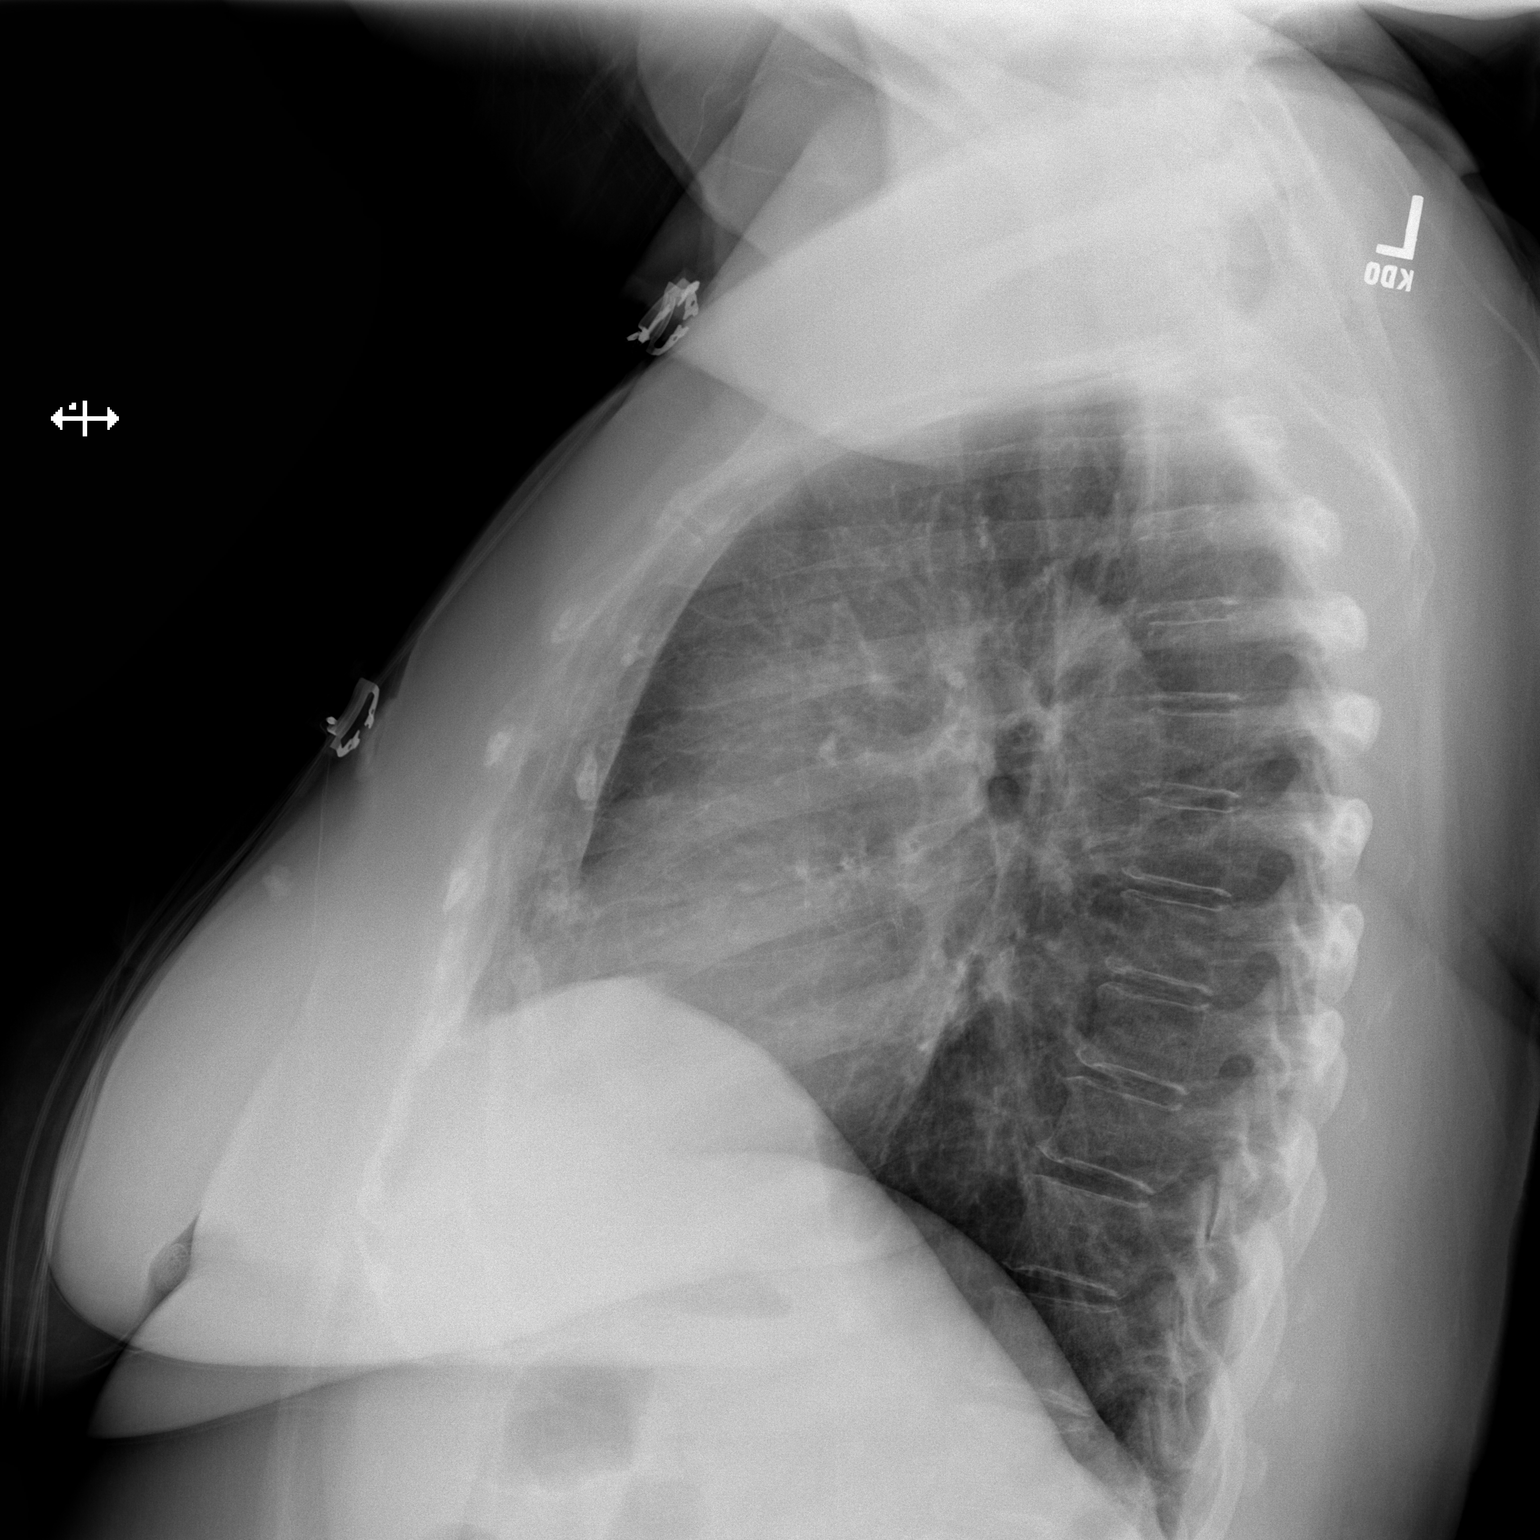

[2 of 2 positions shown; findings below may reference images not displayed]

FINDINGS: The heart size and mediastinal contours are within normal limits.
Both lungs are clear. The visualized skeletal structures are
unremarkable.
IMPRESSION: No active cardiopulmonary disease.

## 2018-07-12 IMAGING — MR MR MRA HEAD W/O CM
8 of 11 series · 28 of 48 positions shown · non-contrast
Comparison: [facility] noncontrast head CT 05/13/2017.

CLINICAL DATA: 73-year-old female with code stroke presentation [facility]. Right upper extremity and right tongue numbness.

EXAM:
MRI HEAD WITHOUT CONTRAST
MRA HEAD WITHOUT CONTRAST
TECHNIQUE: Multiplanar, multiecho pulse sequences of the brain and surrounding
structures were obtained without intravenous contrast. Angiographic
images of the head were obtained using MRA technique without
contrast.

[Series 3: DWI · axial · 3.0mm · 0.94mm/px · z∈[-68,+66]mm · 6 of 92 slices shown (1 of 2)]
[im 1/92]
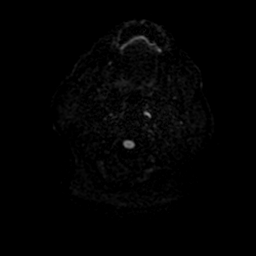
[im 19/92]
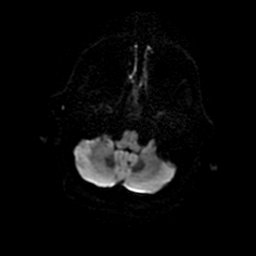
[im 37/92]
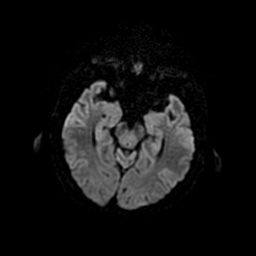
[im 55/92]
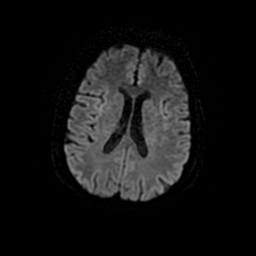
[im 73/92]
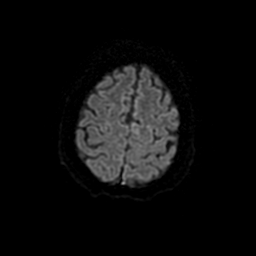
[im 92/92]
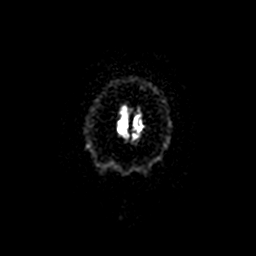

[Series 4: ax (id) 2 · axial · 1.0mm · 0.43mm/px · z∈[-72,+2]mm · 7 of 184 slices shown]
[im 1/184]
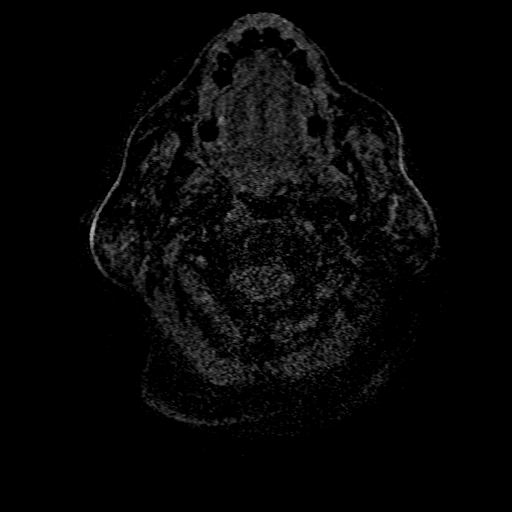
[im 34/184]
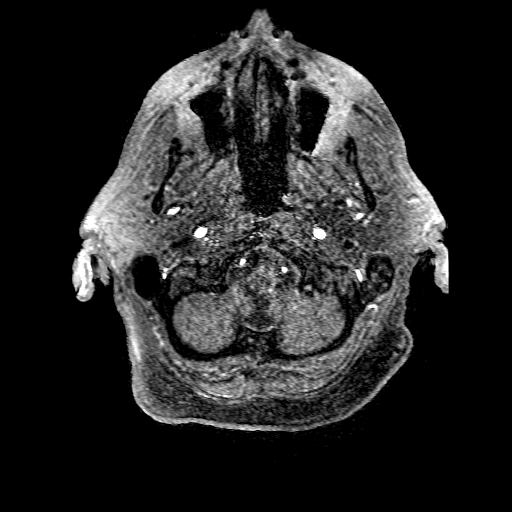
[im 50/184]
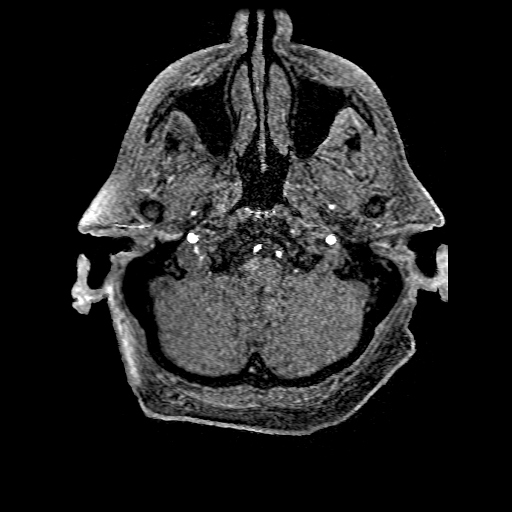
[im 84/184]
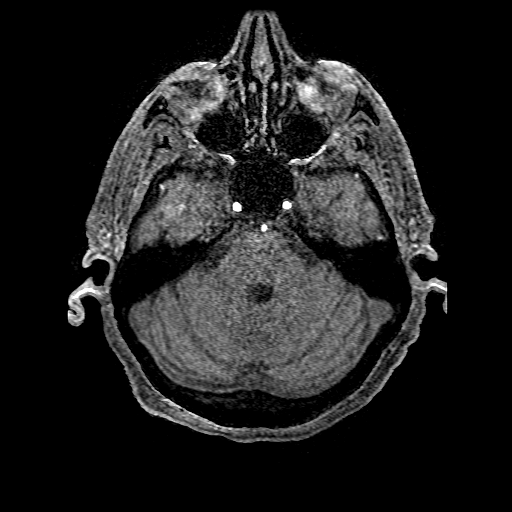
[im 100/184]
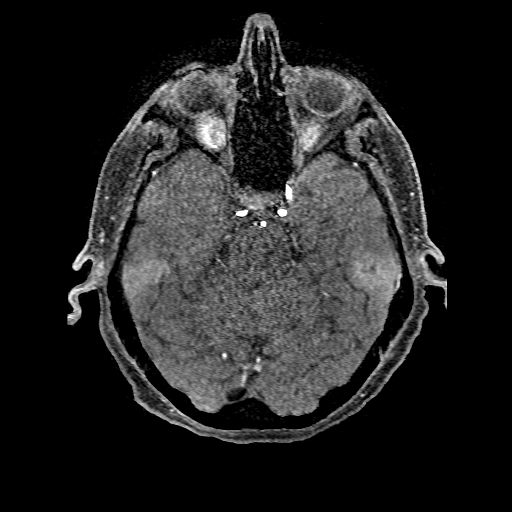
[im 134/184]
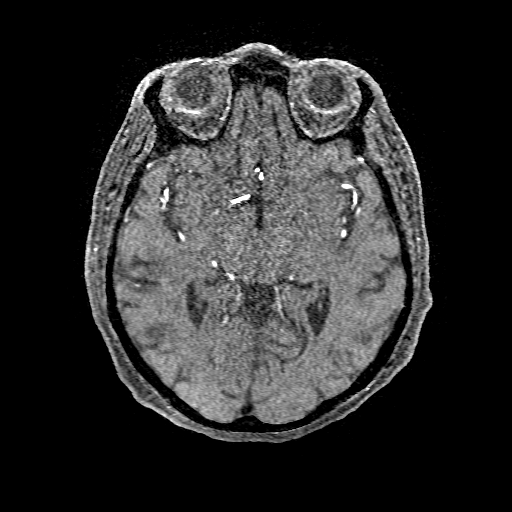
[im 150/184]
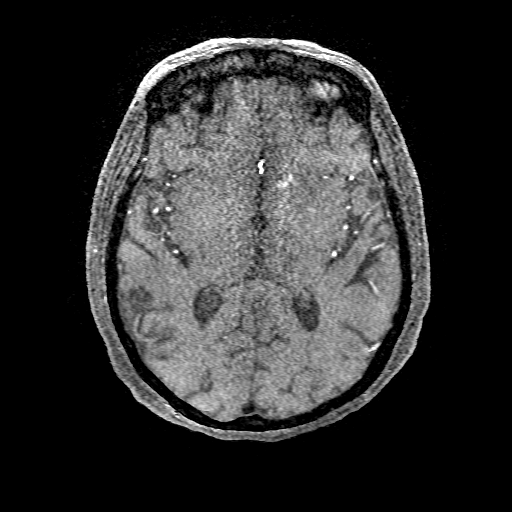

[Series 5: DWI · coronal · 4.0mm · 0.94mm/px · 4 of 62 slices shown (2 of 2)]
[im 1/62]
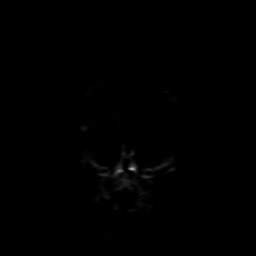
[im 21/62]
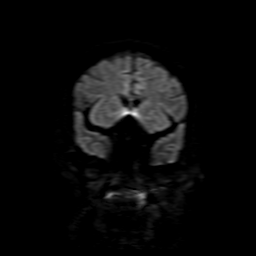
[im 41/62]
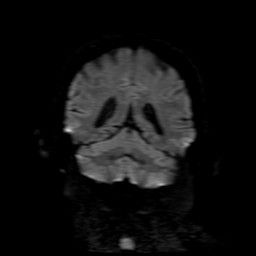
[im 62/62]
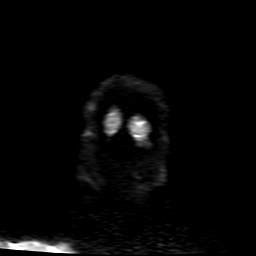

[Series 6: FLAIR · sagittal · 5.0mm · 0.47mm/px · 2 of 23 slices shown (1 of 2)]
[im 1/23]
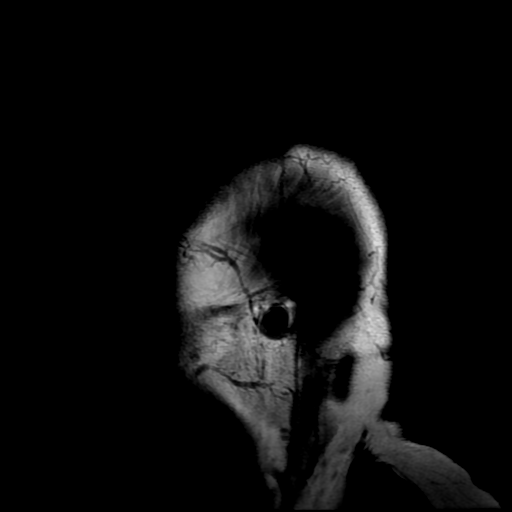
[im 23/23]
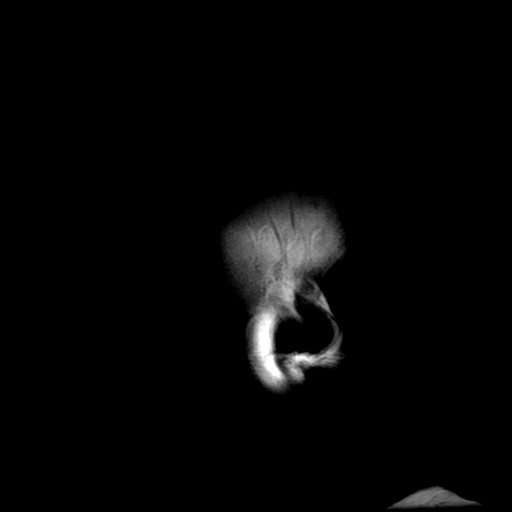

[Series 8: T2 · axial · 5.0mm · 0.47mm/px · z∈[-66,+65]mm · 2 of 23 slices shown]
[im 1/23]
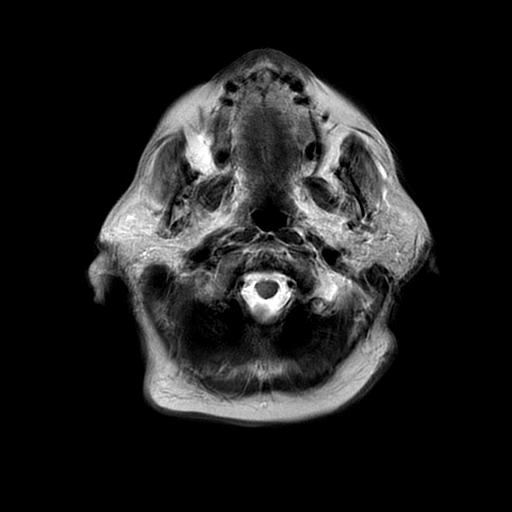
[im 23/23]
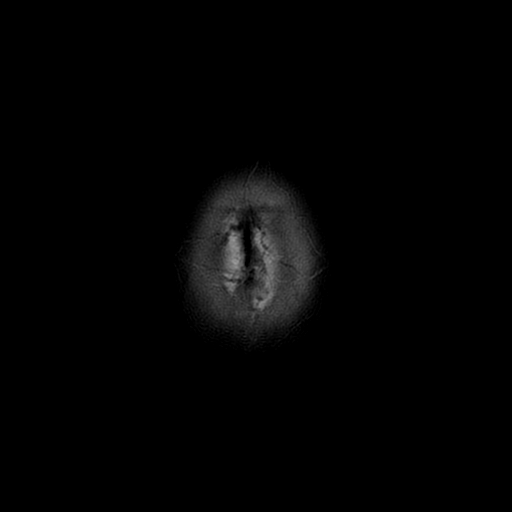

[Series 9: FLAIR · axial · 5.0mm · 0.47mm/px · z∈[-66,+65]mm · 2 of 23 slices shown (2 of 2)]
[im 1/23]
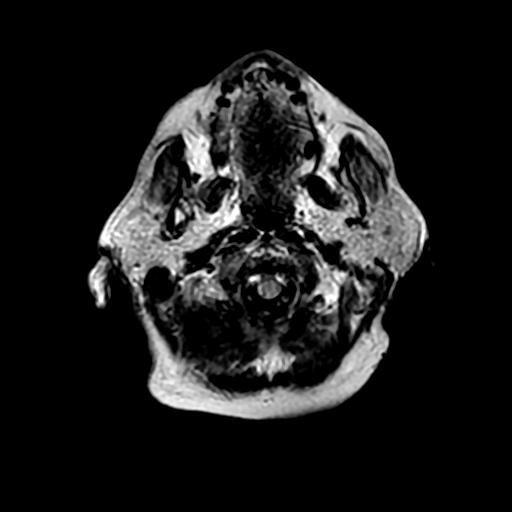
[im 23/23]
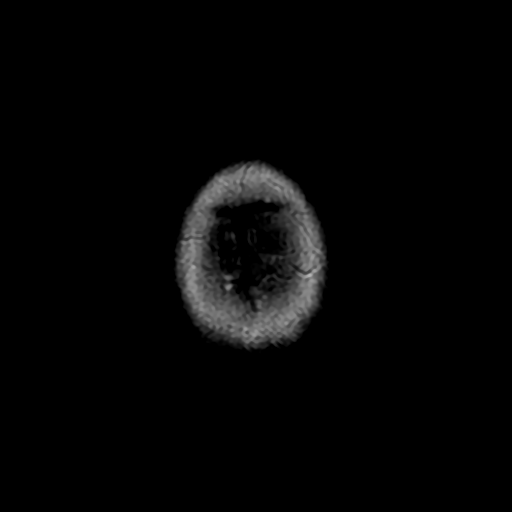

[Series 350: ADC · axial · 3.0mm · 0.94mm/px · z∈[-68,+66]mm · 3 of 46 slices shown (1 of 2)]
[im 1/46]
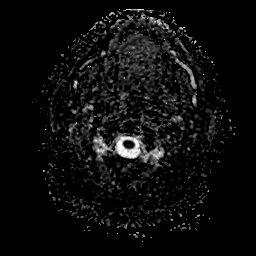
[im 23/46]
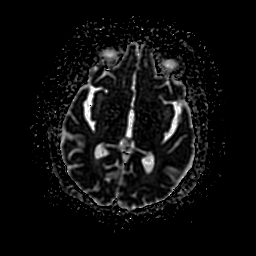
[im 46/46]
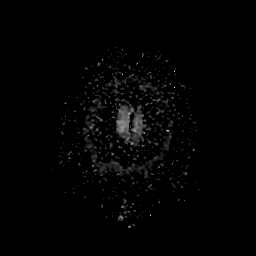

[Series 550: ADC · coronal · 4.0mm · 0.94mm/px · 2 of 31 slices shown (2 of 2)]
[im 1/31]
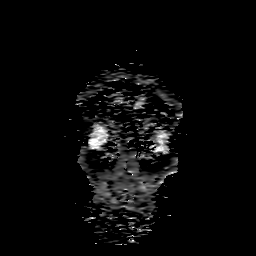
[im 31/31]
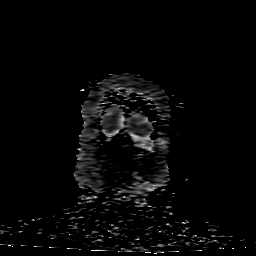

[28 of 48 positions shown; findings below may reference images not displayed]

FINDINGS: MRI HEAD FINDINGS

Brain: Small 7 mm linear focus of restricted diffusion in the
ventral and lateral left thalamus near the posterior limb of the
left internal capsule (series 3, image 23). No significant T2 or
FLAIR hyperintensity. No associated hemorrhage or mass effect.

No other No restricted diffusion to suggest acute infarction. No
midline shift, mass effect, evidence of mass lesion,
ventriculomegaly, extra-axial collection or acute intracranial
hemorrhage. Cervicomedullary junction and pituitary are within
normal limits.

Heterogeneity in the left basal ganglia (especially the left
caudate) and right thalamus is suggestive of superimposed chronic
lacunar infarcts. The brainstem and cerebellum are normal for age.
Minimal cerebral white matter FLAIR hyperintensity. No cortical
encephalomalacia or definite chronic cerebral blood products. No
coronal T2 weighted imaging provided.

Vascular: Major intracranial vascular flow voids are preserved.

Skull and upper cervical spine: Negative. Normal bone marrow signal.

Sinuses/Orbits: Negative. Paranasal sinuses are stable and well
pneumatized.

Other: Trace inferior left mastoid air cell fluid, significance
doubtful. Negative nasopharynx. Visible internal auditory structures
appear normal. Negative scalp soft tissues.

MRA HEAD FINDINGS

Antegrade flow in the posterior circulation with codominant distal
vertebral arteries. No distal vertebral stenosis. Patent left PICA
origin an dominant right AICA origin. Vertebrobasilar junction and
basilar arteries are patent without stenosis. SCA and left PCA
origins are patent. Fetal type right PCA origin is patent. Bilateral
PCA branches are within normal limits. Left posterior communicating
artery is diminutive or absent.

Antegrade flow in both ICA siphons. Siphon irregularity in keeping
with atherosclerosis. Mild to moderate right anterior genu stenosis.
No significant left siphon stenosis. Patent carotid termini. Normal
MCA and ACA origins. Normal ophthalmic and right posterior
communicating artery origins. Diminutive or absent anterior
communicating artery. Visible ACA branches are normal. Left MCA M1
segment and visible left MCA branches are normal. There is early
right M1 branching with mild irregularity of right MCA branches.
IMPRESSION: 1. Small acute lacunar infarct in the left thalamus with no
associated hemorrhage or mass effect.
2. Evidence of chronic left greater than right deep gray matter
small vessel disease. No other acute intracranial abnormality.
3. Intracranial atherosclerosis with mild to moderate right ICA
siphon stenosis, but otherwise negative for age intracranial MRA.

## 2021-04-17 DIAGNOSIS — I361 Nonrheumatic tricuspid (valve) insufficiency: Secondary | ICD-10-CM | POA: Diagnosis not present

## 2021-05-12 DEATH — deceased
